# Patient Record
Sex: Male | Born: 1965 | Race: White | Hispanic: No | Marital: Married | Smoking: Former smoker
Health system: Southern US, Community
[De-identification: ages and names within clinical notes are randomized; demographics above are authoritative.]

## PROBLEM LIST (undated history)

## (undated) DIAGNOSIS — F988 Other specified behavioral and emotional disorders with onset usually occurring in childhood and adolescence: Secondary | ICD-10-CM

## (undated) DIAGNOSIS — I10 Essential (primary) hypertension: Secondary | ICD-10-CM

## (undated) DIAGNOSIS — M51369 Other intervertebral disc degeneration, lumbar region without mention of lumbar back pain or lower extremity pain: Secondary | ICD-10-CM

## (undated) DIAGNOSIS — Z201 Contact with and (suspected) exposure to tuberculosis: Secondary | ICD-10-CM

## (undated) DIAGNOSIS — M5136 Other intervertebral disc degeneration, lumbar region: Secondary | ICD-10-CM

## (undated) HISTORY — DX: Other intervertebral disc degeneration, lumbar region without mention of lumbar back pain or lower extremity pain: M51.369

## (undated) HISTORY — DX: Other intervertebral disc degeneration, lumbar region: M51.36

## (undated) HISTORY — DX: Essential (primary) hypertension: I10

## (undated) HISTORY — PX: BACK SURGERY: SHX140

## (undated) HISTORY — PX: TRIGGER FINGER RELEASE: SHX641

## (undated) HISTORY — PX: SHOULDER SURGERY: SHX246

---

## 2007-01-13 ENCOUNTER — Encounter: Admission: RE | Admit: 2007-01-13 | Discharge: 2007-01-13 | Payer: Self-pay | Admitting: Occupational Medicine

## 2007-06-24 ENCOUNTER — Ambulatory Visit (HOSPITAL_COMMUNITY): Admission: RE | Admit: 2007-06-24 | Discharge: 2007-06-25 | Payer: Self-pay | Admitting: Neurological Surgery

## 2010-06-13 NOTE — Op Note (Signed)
NAMESANTIAGO, STENZEL NO.:  [id number]   MEDICAL RECORD NO.:  [id number]          PATIENT TYPE:  OIB   LOCATION:  3526                         FACILITY:  [facility]   PHYSICIAN:  Stefani Dama, M.D.  DATE OF BIRTH:  [birth year]   DATE OF PROCEDURE:  06/24/2007  DATE OF DISCHARGE:                               OPERATIVE REPORT   PREOPERATIVE DIAGNOSIS:  Herniated nucleus pulposus, L4-L5 with  bilateral lower lumbar radiculopathy, pars defects at L5.   POSTOPERATIVE DIAGNOSIS:  Herniated nucleus pulposus, L4-L5 with  bilateral lower lumbar radiculopathy, pars defects at L5.   PROCEDURES:  Bilateral microdiskectomy at L4-L5 with operating  microscope, microdissection technique.   SURGEON:  Stefani Dama, MD   FIRST ASSISTANT:  Hilda Lias, MD   ANESTHESIA:  General endotracheal.   INDICATIONS:  Alan Odonnell is a 45 year old individual who some 5 or 6  years ago had surgery performed at the L5-S1 level.  He was told that he  had effusion at the time.  He had done well from point of view on his  back until the more recent past.  He notes that he has always had some  low-grade chronic back pain, but not very severe and now he developed  bilateral lower extremity pain that has become unrelenting over the past  5-6 weeks' time.  He notes that he can be comfortable only in the flexed  forward or slightly seated position and standing tends to aggravate the  pain substantially.  He has been advised regarding surgical  decompression of a large centrally herniated disk at L4-L5 with severe  central canal stenosis.   PROCEDURE:  The patient was brought to the operating room supine on the  stretcher after smooth induction of general endotracheal anesthesia.  He  was turned prone.  Back was prepped with alcohol and DuraPrep and draped  in sterile fashion.  A midline incision was created just above the  incision and then the incision was cut elliptically around his old  scar  to excise it.  The dissection was carried down through the subcutaneous  tissues and the lumbodorsal fascia was opened on either side of midline.  Two localizing radiographs identified the L4-L5 space positively.  Then  by dissecting away significant hypertrophied tissue in the region of the  facet in this area, laminotomy was created using a high-speed bur and a  4-mm bit remove portions of bone inferior margin of facets.  Dissection  was carried out to the medial aspect of the facet joints and a partial  facetectomy was completed in this region.  Common dural tube was  identified.  Yellow ligament was thickened and redundant and taken up  and allowed for good decompression of the canal centrally.  The  dissection was then carried down to the path of the L5 nerve root and  there was noted be a significant mass just underneath this lateral  margin of the nerve indenting the nerve and pushing it medially.  Mass  was found to be consistent with a free fragment of disk and this was  carefully dissected and teased away and then removed with a singular  large fragment being removed and then two other smaller fragments also  being found.  This was done from the right side.  This was done with the  use of the operating microscope and microdissection technique and then  the laminotomy on the opposite side was completed and we did a similar  decompression here, no fragments were found; however, the disk space was  entered and it was noted be significantly degenerated with central  bulging of the disk underneath the ligament and subligamentous  herniation of the disk in this area.  Ligament was opened and the disk  was taken out and the common dural tube and lateral recesses were  decompressed well.  Hemostasis in this area was achieved with bipolar  cautery and some small pledgets of Gelfoam.  The disk space was  evacuated of a substantial quantity of significantly degenerated disk   material.  This was done carefully and fully.  Once this was completed,  the hemostasis in the soft tissues was obtained.  Area was irrigated in  the disk space around the nerve roots, around the common dural tube, and  wound was noted that a good decompression was obtained.  Ultimately, the  lumbodorsal fascia was closed with #1 Vicryl in interrupted fashion.  A  20 mL of 0.50% Marcaine was injected into the paraspinous musculature.  A 2-0 Vicryl was used in the subcutaneous tissues, 3-0 Vicryl used  subcuticularly, and a dry sterile dressing was placed on the skin.  The  patient tolerated procedure well and was returned to recovery room in  stable condition.      Stefani Dama, M.D.  Electronically Signed     HJE/MEDQ  D:  06/24/2007  T:  06/25/2007  Job:  [id number]

## 2010-10-10 ENCOUNTER — Inpatient Hospital Stay (INDEPENDENT_AMBULATORY_CARE_PROVIDER_SITE_OTHER)
Admission: RE | Admit: 2010-10-10 | Discharge: 2010-10-10 | Disposition: A | Discharge status: Home / Self Care | Payer: Self-pay | Source: Ambulatory Visit | Attending: Family Medicine | Admitting: Family Medicine

## 2010-10-10 ENCOUNTER — Encounter: Payer: Self-pay | Admitting: Family Medicine

## 2010-10-10 DIAGNOSIS — Z0289 Encounter for other administrative examinations: Secondary | ICD-10-CM

## 2010-10-25 LAB — CBC
Platelets: 231
RDW: 12.7
WBC: 7.1

## 2011-01-01 NOTE — Progress Notes (Signed)
Summary: PHYSICAL rm 5   Vital Signs:  Patient Profile:   45 Years Old Male CC:      [employer] PE Height:     68 inches Weight:      185.75 pounds O2 Sat:      100 % O2 treatment:    Room Air Temp:     98.7 degrees F oral Pulse rate:   94 / minute Resp:     16 per minute BP sitting:   130 / 81  (left arm) Cuff size:   regular  Vitals Entered By: Clemens Catholic LPN (October 10, 2010 11:18 AM)              Vision Screening: Left eye w/o correction: 20 / 40 Right Eye w/o correction: 20 / 25 Both eyes w/o correction:  20/ 25        Vision Entered By: Clemens Catholic LPN (October 10, 2010 11:18 AM)    Updated Prior Medication List: ADDERALL 20 MG TABS (AMPHETAMINE-DEXTROAMPHETAMINE)  ADDERALL XR 20 MG XR24H-CAP (AMPHETAMINE-DEXTROAMPHETAMINE)   Current Allergies: ! VIOXXHistory of Present Illness Chief Complaint: [employer] PE History of Present Illness:  Subjective:  Patient presents for camp physical exam for [employer].  No complaints. Denies chest pain with activity.  No history of loss of consciousness during exercise.  No history of prolonged shortness of breath during exercise No family history of sudden death.  He has a history of L4-L5 back surgery; no recurrent back pain or radicular symptoms.  He has a history of ADD controlled with medication  See physical exam form this date for complete review.   REVIEW OF SYSTEMS Constitutional Symptoms      Denies fever, chills, night sweats, weight loss, weight gain, and fatigue.  Eyes       Denies change in vision, eye pain, eye discharge, glasses, contact lenses, and eye surgery. Ear/Nose/Throat/Mouth       Denies hearing loss/aids, change in hearing, ear pain, ear discharge, dizziness, frequent runny nose, frequent nose bleeds, sinus problems, sore throat, hoarseness, and tooth pain or bleeding.  Respiratory       Denies dry cough, productive cough, wheezing, shortness of breath, asthma, bronchitis, and  emphysema/COPD.  Cardiovascular       Denies murmurs, chest pain, and tires easily with exhertion.    Gastrointestinal       Denies stomach pain, nausea/vomiting, diarrhea, constipation, blood in bowel movements, and indigestion. Genitourniary       Denies painful urination, kidney stones, and loss of urinary control. Neurological       Denies paralysis, seizures, and fainting/blackouts. Musculoskeletal       Denies muscle pain, joint pain, joint stiffness, decreased range of motion, redness, swelling, muscle weakness, and gout.  Skin       Denies bruising, unusual mles/lumps or sores, and hair/skin or nail changes.  Psych       Denies mood changes, temper/anger issues, anxiety/stress, speech problems, depression, and sleep problems. Other Comments: The pt is here today for a [employer] PE.   Past History:  Past Medical History: ADD  Past Surgical History: 2 surgeries on L4-L5 (last surgery 2009) bone graft to LT foot reattachment of LT 4 th finger RT thumb graft  Family History: mom CVA, heart dz father- committed suicide age 58  Social History: Current Smoker less than 1/2 PPD Alcohol use-yes socially Drug use-no Smoking Status:  current Drug Use:  no   Objective:  Normal exam. See physical exam  form this date for exam.  Assessment New Problems: OTH GENERAL MEDICAL EXAMINATION ADMIN PURPOSES (ICD-V70.3)  NO CONTRINDICATIONS TO CAMP/SPORTS PARTICIPATION   Plan New Orders: No Charge Patient Arrived (NCPA0) [NCPA0] Planning Comments:   Form completed   The patient and/or caregiver has been counseled thoroughly with regard to medications prescribed including dosage, schedule, interactions, rationale for use, and possible side effects and they verbalize understanding.  Diagnoses and expected course of recovery discussed and will return if not improved as expected or if the condition worsens. Patient and/or caregiver verbalized understanding.   Orders Added: 1)   No Charge Patient Arrived (NCPA0) [NCPA0]

## 2011-01-03 ENCOUNTER — Emergency Department
Admission: EM | Admit: 2011-01-03 | Discharge: 2011-01-03 | Disposition: A | Discharge status: Home / Self Care | Payer: 59 | Source: Home / Self Care | Attending: Emergency Medicine | Admitting: Emergency Medicine

## 2011-01-03 ENCOUNTER — Encounter: Payer: Self-pay | Admitting: Emergency Medicine

## 2011-01-03 DIAGNOSIS — J069 Acute upper respiratory infection, unspecified: Secondary | ICD-10-CM

## 2011-01-03 DIAGNOSIS — R05 Cough: Secondary | ICD-10-CM

## 2011-01-03 HISTORY — DX: Other specified behavioral and emotional disorders with onset usually occurring in childhood and adolescence: F98.8

## 2011-01-03 MED ORDER — HYDROCODONE-HOMATROPINE 5-1.5 MG/5ML PO SYRP: 5.0000 mL | ORAL_SOLUTION | Freq: Four times a day (QID) | ORAL | Status: AC | PRN

## 2011-01-03 NOTE — ED Provider Notes (Signed)
History     CSN: [id number] Arrival date & time: 01/03/2011 10:30 AM   First MD Initiated Contact with Patient 01/03/11 1037      Chief Complaint  Patient presents with  . URI    (Consider location/radiation/quality/duration/timing/severity/associated sxs/prior treatment) HPI Alan Odonnell is a 45 y.o. male who complains of onset of cold symptoms for 3 days.  He had the flu shot about 2 weeks ago. Multiple people at his work have similar symptoms. No sore throat + cough No pleuritic pain ? wheezing + nasal congestion +post-nasal drainage No sinus pain/pressure No chest congestion No itchy/red eyes + earache No hemoptysis No SOB + chills/sweats + fever No nausea No vomiting No abdominal pain No diarrhea No skin rashes + fatigue + myalgias + headache    Past Medical History  Diagnosis Date  . ADD (attention deficit disorder)     No past surgical history on file.  No family history on file.  History  Substance Use Topics  . Smoking status: Not on file  . Smokeless tobacco: Not on file  . Alcohol Use:       Review of Systems  Allergies  Rofecoxib  Home Medications   Current Outpatient Rx  Name Route Sig Dispense Refill  . AMPHETAMINE-DEXTROAMPHET ER 20 MG PO CP24 Oral Take 20 mg by mouth every morning.        BP 123/83  Pulse 99  Temp(Src) 98.9 F (37.2 C) (Oral)  Resp 16  Ht 5\' 10"  (1.778 m)  Wt 188 lb (85.276 kg)  BMI 26.98 kg/m2  SpO2 99%  Physical Exam  Nursing note and vitals reviewed. Constitutional: He is oriented to person, place, and time. He appears well-developed and well-nourished.  HENT:  Head: Normocephalic and atraumatic.  Right Ear: Tympanic membrane, external ear and ear canal normal.  Left Ear: Tympanic membrane, external ear and ear canal normal.  Nose: Mucosal edema and rhinorrhea present.  Mouth/Throat: Posterior oropharyngeal erythema present. No oropharyngeal exudate or posterior oropharyngeal edema.  Neck: Neck  supple.  Cardiovascular: Regular rhythm and normal heart sounds.   Pulmonary/Chest: Effort normal and breath sounds normal. No respiratory distress.  Neurological: He is alert and oriented to person, place, and time.  Skin: Skin is warm and dry.  Psychiatric: He has a normal mood and affect. His speech is normal.    ED Course  Procedures (including critical care time)  Labs Reviewed - No data to display No results found.   No diagnosis found.    MDM  1) rapid flu test is positive for type A. No antibiotics given today. Since it has been already on multiple days, I also did not give him Tamiflu. I did however give him a prescription for cough medicine to use at night  And a note for work. 2)  Use nasal saline solution (over the counter) at least 3 times a day. 3)  Use over the counter decongestants like Zyrtec-D every 12 hours as needed to help with congestion.  If you have hypertension, do not take medicines with sudafed.  4)  Can take tylenol every 6 hours or motrin every 8 hours for pain or fever. 5)  Follow up with your primary doctor if no improvement in 5-7 days, sooner if increasing pain, fever, or new symptoms.     Lily Kocher, MD 01/03/11 1056

## 2011-01-03 NOTE — ED Notes (Signed)
Sinus drainage, cough, headache, body aches, chills x 3days

## 2011-09-06 ENCOUNTER — Encounter: Payer: Self-pay | Admitting: Family Medicine

## 2011-10-08 ENCOUNTER — Encounter: Payer: Self-pay | Admitting: *Deleted

## 2011-10-08 DIAGNOSIS — M5136 Other intervertebral disc degeneration, lumbar region: Secondary | ICD-10-CM | POA: Insufficient documentation

## 2011-10-08 DIAGNOSIS — M51369 Other intervertebral disc degeneration, lumbar region without mention of lumbar back pain or lower extremity pain: Secondary | ICD-10-CM | POA: Insufficient documentation

## 2014-03-15 ENCOUNTER — Observation Stay (HOSPITAL_COMMUNITY)
Admission: EM | Admit: 2014-03-15 | Discharge: 2014-03-18 | Disposition: A | Discharge status: Home / Self Care | Payer: Managed Care, Other (non HMO) | Attending: Internal Medicine | Admitting: Internal Medicine

## 2014-03-15 ENCOUNTER — Emergency Department (HOSPITAL_COMMUNITY): Payer: Managed Care, Other (non HMO)

## 2014-03-15 ENCOUNTER — Encounter (HOSPITAL_COMMUNITY): Payer: Self-pay | Admitting: Family Medicine

## 2014-03-15 DIAGNOSIS — R072 Precordial pain: Secondary | ICD-10-CM

## 2014-03-15 DIAGNOSIS — I2510 Atherosclerotic heart disease of native coronary artery without angina pectoris: Secondary | ICD-10-CM | POA: Diagnosis not present

## 2014-03-15 DIAGNOSIS — R079 Chest pain, unspecified: Secondary | ICD-10-CM | POA: Diagnosis present

## 2014-03-15 DIAGNOSIS — M5136 Other intervertebral disc degeneration, lumbar region: Secondary | ICD-10-CM | POA: Insufficient documentation

## 2014-03-15 DIAGNOSIS — Z720 Tobacco use: Secondary | ICD-10-CM | POA: Diagnosis present

## 2014-03-15 DIAGNOSIS — F988 Other specified behavioral and emotional disorders with onset usually occurring in childhood and adolescence: Secondary | ICD-10-CM | POA: Diagnosis not present

## 2014-03-15 DIAGNOSIS — F17210 Nicotine dependence, cigarettes, uncomplicated: Secondary | ICD-10-CM | POA: Insufficient documentation

## 2014-03-15 DIAGNOSIS — R0602 Shortness of breath: Secondary | ICD-10-CM | POA: Diagnosis present

## 2014-03-15 DIAGNOSIS — J40 Bronchitis, not specified as acute or chronic: Secondary | ICD-10-CM

## 2014-03-15 DIAGNOSIS — Z8249 Family history of ischemic heart disease and other diseases of the circulatory system: Secondary | ICD-10-CM

## 2014-03-15 DIAGNOSIS — I2584 Coronary atherosclerosis due to calcified coronary lesion: Secondary | ICD-10-CM | POA: Diagnosis not present

## 2014-03-15 DIAGNOSIS — R05 Cough: Secondary | ICD-10-CM

## 2014-03-15 HISTORY — DX: Contact with and (suspected) exposure to tuberculosis: Z20.1

## 2014-03-15 LAB — CBC
HCT: 43.2 % (ref 39.0–52.0)
HCT: 40 % (ref 39.0–52.0)
Hemoglobin: 14.6 g/dL (ref 13.0–17.0)
Hemoglobin: 13.2 g/dL (ref 13.0–17.0)
MCH: 30 pg (ref 26.0–34.0)
MCH: 30.1 pg (ref 26.0–34.0)
MCHC: 33.8 g/dL (ref 30.0–36.0)
MCHC: 33 g/dL (ref 30.0–36.0)
MCV: 88.9 fL (ref 78.0–100.0)
MCV: 91.1 fL (ref 78.0–100.0)
PLATELETS: 171 10*3/uL (ref 150–400)
Platelets: 177 10*3/uL (ref 150–400)
RBC: 4.86 MIL/uL (ref 4.22–5.81)
RBC: 4.39 MIL/uL (ref 4.22–5.81)
RDW: 13 % (ref 11.5–15.5)
RDW: 13.1 % (ref 11.5–15.5)
WBC: 5.5 10*3/uL (ref 4.0–10.5)
WBC: 3.9 10*3/uL — AB (ref 4.0–10.5)

## 2014-03-15 LAB — LIPID PANEL
Cholesterol: 175 mg/dL (ref 0–200)
HDL: 40 mg/dL (ref >39)
LDL CALC: 113 mg/dL — AB (ref 0–99)
TRIGLYCERIDES: 109 mg/dL (ref <150)
Total CHOL/HDL Ratio: 4.4 RATIO
VLDL: 22 mg/dL (ref 0–40)

## 2014-03-15 LAB — BASIC METABOLIC PANEL
Anion gap: 7 (ref 5–15)
BUN: 13 mg/dL (ref 6–23)
CALCIUM: 9 mg/dL (ref 8.4–10.5)
CO2: 26 mmol/L (ref 19–32)
Chloride: 103 mmol/L (ref 96–112)
Creatinine, Ser: 0.95 mg/dL (ref 0.50–1.35)
Glucose, Bld: 97 mg/dL (ref 70–99)
Potassium: 4 mmol/L (ref 3.5–5.1)
SODIUM: 136 mmol/L (ref 135–145)

## 2014-03-15 LAB — D-DIMER, QUANTITATIVE: D-Dimer, Quant: <0.27 ug/mL-FEU (ref 0.00–0.48)

## 2014-03-15 LAB — CREATININE, SERUM
CREATININE: 0.97 mg/dL (ref 0.50–1.35)
GFR calc Af Amer: >90 mL/min (ref >90)

## 2014-03-15 LAB — TROPONIN I
Troponin I: <0.03 ng/mL (ref <0.031)
Troponin I: <0.03 ng/mL (ref <0.031)

## 2014-03-15 LAB — I-STAT TROPONIN, ED: Troponin i, poc: 0 ng/mL (ref 0.00–0.08)

## 2014-03-15 LAB — BRAIN NATRIURETIC PEPTIDE: B Natriuretic Peptide: 7.4 pg/mL (ref 0.0–100.0)

## 2014-03-15 MED ORDER — AMPHETAMINE-DEXTROAMPHETAMINE 10 MG PO TABS
20.0000 mg | ORAL_TABLET | Freq: Two times a day (BID) | ORAL | Status: DC
  Administered 2014-03-16 – 2014-03-18 (×5): 20 mg via ORAL
  Filled 2014-03-15 (×5): qty 2

## 2014-03-15 MED ORDER — NICOTINE 21 MG/24HR TD PT24
21.0000 mg | MEDICATED_PATCH | Freq: Every day | TRANSDERMAL | Status: DC
  Administered 2014-03-15 – 2014-03-18 (×4): 21 mg via TRANSDERMAL
  Filled 2014-03-15 (×4): qty 1

## 2014-03-15 MED ORDER — MORPHINE SULFATE 4 MG/ML IJ SOLN
4.0000 mg | Freq: Once | INTRAMUSCULAR | Status: AC
  Administered 2014-03-15: 4 mg via INTRAVENOUS
  Filled 2014-03-15: qty 1

## 2014-03-15 MED ORDER — ASPIRIN EC 325 MG PO TBEC
325.0000 mg | DELAYED_RELEASE_TABLET | Freq: Every day | ORAL | Status: DC
  Administered 2014-03-16 – 2014-03-18 (×3): 325 mg via ORAL
  Filled 2014-03-15 (×4): qty 1

## 2014-03-15 MED ORDER — GI COCKTAIL ~~LOC~~: 30.0000 mL | Freq: Four times a day (QID) | ORAL | Status: DC | PRN

## 2014-03-15 MED ORDER — ALPRAZOLAM 0.25 MG PO TABS: 0.2500 mg | ORAL_TABLET | Freq: Two times a day (BID) | ORAL | Status: DC | PRN

## 2014-03-15 MED ORDER — ENOXAPARIN SODIUM 40 MG/0.4ML ~~LOC~~ SOLN
40.0000 mg | SUBCUTANEOUS | Status: DC
  Administered 2014-03-15 – 2014-03-16 (×2): 40 mg via SUBCUTANEOUS
  Filled 2014-03-15 (×2): qty 0.4

## 2014-03-15 MED ORDER — AZITHROMYCIN 250 MG PO TABS
250.0000 mg | ORAL_TABLET | Freq: Every day | ORAL | Status: DC
  Administered 2014-03-16 – 2014-03-18 (×3): 250 mg via ORAL
  Filled 2014-03-15 (×3): qty 1

## 2014-03-15 MED ORDER — METOPROLOL TARTRATE 1 MG/ML IV SOLN
5.0000 mg | INTRAVENOUS | Status: DC | PRN
  Administered 2014-03-16 (×2): 5 mg via INTRAVENOUS
  Filled 2014-03-15: qty 5

## 2014-03-15 MED ORDER — METOPROLOL TARTRATE 25 MG PO TABS
25.0000 mg | ORAL_TABLET | Freq: Once | ORAL | Status: AC
  Administered 2014-03-16: 25 mg via ORAL
  Filled 2014-03-15: qty 1

## 2014-03-15 MED ORDER — AZITHROMYCIN 250 MG PO TABS
500.0000 mg | ORAL_TABLET | Freq: Every day | ORAL | Status: AC
  Administered 2014-03-15: 500 mg via ORAL
  Filled 2014-03-15: qty 2

## 2014-03-15 MED ORDER — ACETAMINOPHEN 325 MG PO TABS
650.0000 mg | ORAL_TABLET | ORAL | Status: DC | PRN
  Administered 2014-03-15 (×2): 650 mg via ORAL
  Filled 2014-03-15 (×3): qty 2

## 2014-03-15 MED ORDER — NITROGLYCERIN 2 % TD OINT
1.0000 [in_us] | TOPICAL_OINTMENT | Freq: Once | TRANSDERMAL | Status: AC
  Administered 2014-03-15: 1 [in_us] via TOPICAL
  Filled 2014-03-15: qty 1

## 2014-03-15 MED ORDER — GI COCKTAIL ~~LOC~~
30.0000 mL | Freq: Once | ORAL | Status: AC
  Administered 2014-03-15: 30 mL via ORAL
  Filled 2014-03-15: qty 30

## 2014-03-15 MED ORDER — ONDANSETRON HCL 4 MG/2ML IJ SOLN: 4.0000 mg | Freq: Four times a day (QID) | INTRAMUSCULAR | Status: DC | PRN

## 2014-03-15 MED ORDER — KETOROLAC TROMETHAMINE 30 MG/ML IJ SOLN
30.0000 mg | Freq: Once | INTRAMUSCULAR | Status: AC
  Administered 2014-03-15: 30 mg via INTRAVENOUS
  Filled 2014-03-15: qty 1

## 2014-03-15 MED ORDER — MORPHINE SULFATE 2 MG/ML IJ SOLN
2.0000 mg | INTRAMUSCULAR | Status: DC | PRN
  Administered 2014-03-15 – 2014-03-16 (×4): 2 mg via INTRAVENOUS
  Filled 2014-03-15 (×4): qty 1

## 2014-03-15 MED ORDER — SODIUM CHLORIDE 0.9 % IV BOLUS (SEPSIS)
1000.0000 mL | Freq: Once | INTRAVENOUS | Status: AC
  Administered 2014-03-15: 1000 mL via INTRAVENOUS

## 2014-03-15 NOTE — ED Notes (Signed)
Patient transported to X-ray 

## 2014-03-15 NOTE — ED Provider Notes (Signed)
CSN: [id number]     Arrival date & time 03/15/14  1300 History   First MD Initiated Contact with Patient 03/15/14 1301     No chief complaint on file.    (Consider location/radiation/quality/duration/timing/severity/associated sxs/prior Treatment) HPI Comments: Alan Odonnell is a 49 y.o. male with a PMHx of ADD and lumbar DDD, who presents to the ED with complaints of sudden onset chest pain that began at 7 AM while patient was standing. He states that the pain is a central and left-sided pressure-like pain which is nonradiating and constant, improved with nitroglycerin 2 and aspirin given in route, and with no known aggravating factors. He states that at the worst it was 8/10, although now he states it's a 2/10. Denies any change with exertion, breathing, or movement. States that 2-3 days ago he developed left shoulder pain and shortness of breath with a cough and clear sputum production, but did not think this was concerning until he developed chest pain earlier today. He states that when he felt the chest pain started, he felt lightheaded and diaphoretic which alarmed him and caused him to call 911. Additionally endorses some sinus congestion and clear rhinorrhea with intermittent headaches which he states feels like sinus pressure in the front of his head. He denies any fevers, chills, jaw neck or back pain, leg swelling, recent travel or immobilization, recent surgeries or sick contacts, history of DVT or PE, abdominal pain, nausea, vomiting, diarrhea, constipation, heartburn, dysuria, hematuria, numbness, tingling, weakness, or IV drug use. Denies syncope or vision changes. States the HA is similar to prior headaches. States he's on his feet a lot, and denies claudication or PND. States he's never had cardiac issues before, but has a family hx of MI in both grandparents.   Patient is a 49 y.o. male presenting with chest pain. The history is provided by the patient. No language interpreter was used.    Chest Pain Pain location:  Substernal area and L chest Pain quality: pressure   Pain radiates to:  Does not radiate Pain radiates to the back: no   Pain severity:  Mild (8/10 at worst, 2/10 at present) Onset quality:  Sudden Duration:  6 hours Timing:  Constant Progression:  Waxing and waning Chronicity:  New Context: at rest   Relieved by:  Aspirin and nitroglycerin Worsened by:  Nothing tried Ineffective treatments:  None tried Associated symptoms: cough, diaphoresis, headache (now resolved) and shortness of breath   Associated symptoms: no abdominal pain, no altered mental status, no anxiety, no back pain, no claudication, no dizziness, no dysphagia, no fever, no heartburn, no lower extremity edema, no nausea, no numbness, no orthopnea, no palpitations, no PND, no syncope, not vomiting and no weakness   Risk factors: male sex and smoking   Risk factors: no coronary artery disease, no diabetes mellitus, no hypertension, no immobilization, no prior DVT/PE and no surgery     Past Medical History  Diagnosis Date  . ADD (attention deficit disorder)   . DDD (degenerative disc disease), lumbar    No past surgical history on file. No family history on file. History  Substance Use Topics  . Smoking status: Not on file  . Smokeless tobacco: Not on file  . Alcohol Use: Not on file    Review of Systems  Constitutional: Positive for diaphoresis. Negative for fever and chills.  HENT: Positive for congestion, rhinorrhea and sinus pressure. Negative for ear discharge, ear pain, sore throat and trouble swallowing.   Eyes: Negative for  visual disturbance.  Respiratory: Positive for cough and shortness of breath. Negative for wheezing.   Cardiovascular: Positive for chest pain. Negative for palpitations, orthopnea, claudication, leg swelling, syncope and PND.  Gastrointestinal: Negative for heartburn, nausea, vomiting, abdominal pain, diarrhea, constipation and blood in stool.   Genitourinary: Negative for dysuria, hematuria and flank pain.  Musculoskeletal: Negative for myalgias, back pain and arthralgias.  Skin: Negative for color change.  Allergic/Immunologic: Negative for immunocompromised state.  Neurological: Positive for light-headedness (now resolved) and headaches (now resolved). Negative for dizziness, syncope, weakness and numbness.  Psychiatric/Behavioral: Negative for confusion.   10 Systems reviewed and are negative for acute change except as noted in the HPI.    Allergies  Rofecoxib  Home Medications   Prior to Admission medications   Medication Sig Start Date End Date Taking? Authorizing Provider  amphetamine-dextroamphetamine (ADDERALL XR) 20 MG 24 hr capsule Take 20 mg by mouth every morning.      Historical Provider, MD   BP 132/77 mmHg  Pulse 108  Temp(Src) 99.3 F (37.4 C) (Oral)  Resp 18  SpO2 97% Physical Exam  Constitutional: He is oriented to person, place, and time. Vital signs are normal. He appears well-developed and well-nourished.  Non-toxic appearance. No distress.  Afebrile, nontoxic, NAD, slightly tachycardic with HR ranging from 98-104 during exam. Speaking in full sentences, no hypoxia  HENT:  Head: Normocephalic and atraumatic.  Nose: Mucosal edema and rhinorrhea present. Right sinus exhibits maxillary sinus tenderness. Left sinus exhibits maxillary sinus tenderness.  Mouth/Throat: Uvula is midline, oropharynx is clear and moist and mucous membranes are normal. No trismus in the jaw. No uvula swelling.  Ears clear bilaterally Nose with mild edema and erythema, slight rhinorrhea, with maxillary sinus TTP bilaterally Oropharynx clear  Eyes: Conjunctivae and EOM are normal. Pupils are equal, round, and reactive to light. Right eye exhibits no discharge. Left eye exhibits no discharge.  Neck: Normal range of motion. Neck supple. No JVD present.  FROM intact without spinous process or paraspinous muscle TTP, no bony  stepoffs or deformities, no muscle spasms. No rigidity or meningeal signs. No bruising or swelling. No JVD  Cardiovascular: Regular rhythm, normal heart sounds and intact distal pulses.  Tachycardia present.  Exam reveals no gallop and no friction rub.   No murmur heard. HR 98-104 during exam, reg rhythm, nl s1/s2, no m/r/g, distal pulses intact, no pedal edema  Pulmonary/Chest: Effort normal. No respiratory distress. He has decreased breath sounds. He has no wheezes. He has no rhonchi. He has no rales. He exhibits tenderness. He exhibits no crepitus, no deformity and no retraction.    CTAB in all lung fields, no w/r/r, although possibly slightly diminished breath sounds in bases, difficult to distinguish from poor inspiratory effort. No hypoxia or increased WOB, speaking in full sentences, SpO2 97% on RA  Mildly TTP over central and L lower rib cage, no crepitus or retraction  Abdominal: Soft. Normal appearance and bowel sounds are normal. He exhibits no distension. There is no tenderness. There is no rigidity, no rebound, no guarding, no CVA tenderness, no tenderness at McBurney's point and negative Murphy's sign.  Musculoskeletal: Normal range of motion.  MAE x4 Strength and sensation grossly intact Neg homan's bilaterally No pedal edema  Neurological: He is alert and oriented to person, place, and time. He has normal strength. No sensory deficit.  Skin: Skin is warm, dry and intact. No rash noted.  Psychiatric: He has a normal mood and affect.  Nursing note and  vitals reviewed.   ED Course  Procedures (including critical care time) Labs Review Labs Reviewed  CBC  BASIC METABOLIC PANEL  BRAIN NATRIURETIC PEPTIDE  D-DIMER, QUANTITATIVE  I-STAT TROPOININ, ED    Imaging Review Dg Chest 2 View  03/15/2014   CLINICAL DATA:  Left-sided chest pain starting today.  EXAM: CHEST  2 VIEW  COMPARISON:  None.  FINDINGS: There is mild interstitial coarsening from bronchial cuffing. There is no  edema, consolidation, effusion, or pneumothorax. Normal heart size and mediastinal contours. No osseous findings to explain chest pain.  IMPRESSION: 1. Mild bronchitic changes. 2. No explanation for left chest pain.   Electronically Signed   By: Marnee Spring M.D.   On: 03/15/2014 14:24     EKG Interpretation   Date/Time:  Monday March 15 2014 13:06:06 EST Ventricular Rate:  103 PR Interval:  132 QRS Duration: 101 QT Interval:  326 QTC Calculation: 427 R Axis:   33 Text Interpretation:  Sinus tachycardia Abnormal R-wave progression, early  transition Confirmed by ZAVITZ  MD, JOSHUA ([id number]) on 03/15/2014 3:13:57 PM      MDM   Final diagnoses:  Chest pain, unspecified chest pain type  SOB (shortness of breath)  Bronchitis    49 y.o. male here with CP x6hrs, SOB and shoulder pain x2-3 days, cough x2-3 days with white sputum production. Given ASA and NTG en route with relief of symptoms. Found to be in sinus tachycardia, HR goes from 98-104 during exam. EKG showing sinus tachy and R wave early transition. Will obtain CXR and labs. Chest pain is somewhat reproducible on exam therefore could be musculoskeletal, but given tachycardia will obtain dimer since pt is low risk. If dimer is positive, will need CTA. Symptoms unchanged with exertion, but given family hx of heart attacks, may need obs admission for ACS rule out since symptoms improved with NTG and ASA and story is somewhat convincing for cardiac. Will give morphine now since ASA and NTG given already with relief. Will give GI cocktail. Will reassess shortly.   2:45 PM Pt having return of CP, will give NTG paste. Trop neg, Dimer neg, CBC and BMP WNL. BNP WNL. CXR showing mild bronchitic changes which could be cause of cough, and could possibly explain some of his symptoms although given that history is somewhat concerning for cardiac etiology, and recurrence of symptoms while here, will like admit. HEART score is 4.   3:23 PM Pt  having worsening of HA since being given NTG paste. Will give toradol, and will admit for chest pain rule out. No longer tachycardic and chest pain somewhat improved.   3:43 PM Chales Abrahams York PA-C from [facility] returning page, will admit pt to tele obs for cardiac rule out. Please see her dictation for further documentation of care.   BP 127/73 mmHg  Pulse 86  Temp(Src) 99.3 F (37.4 C) (Oral)  Resp 15  SpO2 98%  Meds ordered this encounter  Medications  . morphine 4 MG/ML injection 4 mg    Sig:   . gi cocktail (Maalox,Lidocaine,Donnatal)    Sig:   . sodium chloride 0.9 % bolus 1,000 mL    Sig:   . amphetamine-dextroamphetamine (ADDERALL) 20 MG tablet    Sig: Take 20 mg by mouth 2 (two) times daily.  . nitroGLYCERIN (NITROGLYN) 2 % ointment 1 inch    Sig:   . ketorolac (TORADOL) 30 MG/ML injection 30 mg    Sig:      Alan Augusta Strupp Camprubi-Soms,  PA-C 03/15/14 1544  Enid Skeens, MD 03/15/14 1700
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
## 2014-03-15 NOTE — ED Notes (Signed)
Pt having chest pain that started today. sts also some mild SOB over the past few days. sts wasn't doing anything strenuous when the pain started. sts 2 nitro in route and helped with pain. sts highest pain 4/10. Denies nausea.

## 2014-03-15 NOTE — Consult Note (Signed)
Cardiology Consult Note  Patient ID: Kimberley Dastrup MRN: [id number], DOB/AGE: [birth year]   Admit date: 03/15/2014   Primary Physician: Pcp Not In System Primary Cardiologist: New  Pt. Profile:   49 year old Caucasian male with past medical history of ADD and lumbar disease present with CP at rest  Problem List  Past Medical History  Diagnosis Date  . ADD (attention deficit disorder)   . DDD (degenerative disc disease), lumbar     Past Surgical History  Procedure Laterality Date  . Back surgery       Allergies  Allergies  Allergen Reactions  . Rofecoxib     unknown    HPI  The patient is a 49 year old Caucasian male with past medical history of ADD and lumbar disease. He has no known past cardiac history and has never seen a cardiologist before. He denies any prior history of strokes lower extremity edema, orthopnea or paroxysmal nocturnal dyspnea. He works as a Journalist, newspaper, and his job involves heavy activities. He has not noticed any prior history of exertional chest pain or increasing shortness breath. 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 He did not go last month because his back pain. For the last 2 days, patient had a URI symptom involve productive cough and the left shoulder pain. This morning, while sitting in the front seat of a car doing diagnosis, he started experiencing left-sided substernal chest squeezing sensation. He also endorsed some diaphoresis and dizziness. The discomfort gradually intensified prompting the patient to seek medical attention at [facility].  On arrival his blood pressure was 132/77. Heart rate was elevated at 108. Temperature 99.3. CBC and BMP was WNL. BNP 7.4. Troponin negative. D-dimer negative. Chest x-ray was negative for acute process except mild bronchitic changes. Initial EKG showed sinus tachycardia without significant ST-T wave changes. Cardiology has been consulted for chest  pain.  Home Medications  Prior to Admission medications   Medication Sig Start Date End Date Taking? Authorizing Provider  amphetamine-dextroamphetamine (ADDERALL) 20 MG tablet Take 20 mg by mouth 2 (two) times daily.   Yes Historical Provider, MD    Family History  Family History  Problem Relation Age of Onset  . Stroke Mother   . Heart attack Mother     Social History  History   Social History  . Marital Status: Married    Spouse Name: N/A  . Number of Children: N/A  . Years of Education: N/A   Occupational History  . Not on file.   Social History Main Topics  . Smoking status: Current Every Day Smoker    Types: Cigarettes  . Smokeless tobacco: Not on file     Comment: per pt "light" smoker since age 60, did quit for 5 yrs in between, however has since picked up again  . Alcohol Use: 0.0 oz/week    0 Standard drinks or equivalent per week     Comment: 1-2 glass of wine per night  . Drug Use: No  . Sexual Activity: Not on file   Other Topics Concern  . Not on file   Social History Narrative     Review of Systems General:  No chills, fever, night sweats or weight changes.  Cardiovascular:  No dyspnea on exertion, edema, orthopnea, palpitations, paroxysmal nocturnal dyspnea. +chest pain Dermatological: No rash, lesions/masses Respiratory: No cough, dyspnea Urologic: No hematuria, dysuria Abdominal:   No nausea, vomiting, diarrhea, bright red blood per rectum, melena, or  hematemesis Neurologic:  No visual changes, wkns, changes in mental status. All other systems reviewed and are otherwise negative except as noted above.  Physical Exam  Blood pressure 111/75, pulse 82, temperature 99.3 F (37.4 C), temperature source Oral, resp. rate 19, SpO2 97 %.  General: Pleasant, NAD Psych: Normal affect. Neuro: Alert and oriented X 3. Moves all extremities spontaneously. HEENT: Normal  Neck: Supple without bruits or JVD. Lungs:  Resp regular and unlabored,  CTA. Heart: RRR no s3, s4, or murmurs. Abdomen: Soft, non-tender, non-distended, BS + x 4.  Extremities: No clubbing, cyanosis or edema. DP/PT/Radials 2+ and equal bilaterally.  Labs  Troponin [facility](Point of Care Test)  Recent Labs  03/15/14 1405  TROPIPOC 0.00   No results for input(s): CKTOTAL, CKMB, TROPONINI in the last 72 hours. Lab Results  Component Value Date   WBC 5.5 03/15/2014   HGB 14.6 03/15/2014   HCT 43.2 03/15/2014   MCV 88.9 03/15/2014   PLT 177 03/15/2014     Recent Labs Lab 03/15/14 1310  NA 136  K 4.0  CL 103  CO2 26  BUN 13  CREATININE 0.95  CALCIUM 9.0  GLUCOSE 97   No results found for: CHOL, HDL, LDLCALC, TRIG Lab Results  Component Value Date   DDIMER <0.27 03/15/2014     Radiology/Studies  Dg Chest 2 View  03/15/2014   CLINICAL DATA:  Left-sided chest pain starting today.  EXAM: CHEST  2 VIEW  COMPARISON:  None.  FINDINGS: There is mild interstitial coarsening from bronchial cuffing. There is no edema, consolidation, effusion, or pneumothorax. Normal heart size and mediastinal contours. No osseous findings to explain chest pain.  IMPRESSION: 1. Mild bronchitic changes. 2. No explanation for left chest pain.   Electronically Signed   By: Marnee SpringJonathon  Watts M.D.   On: 03/15/2014 14:24    ECG  Sinus tach with HR 100s   ASSESSMENT AND PLAN  1. Persistent chest pain  - atypical, cannot r/o PNA or bronchitis  - negative trop or EKG changes, improved with nitro  - cardiac risk factor: smoking, FHx  - trend trop overnight, plan to obtain coronary CT  2. Tobacco abuse 3. ADD   Signed, Azalee CourseMeng, Hao, PA-C 03/15/2014, 5:01 PM   The patient was seen, examined and discussed with Azalee CourseHao Meng, PA-C and I agree with the above.   A pleasant 49 year old male who presented with non-exertional sharp chest pain associated with SOB relieved by NTG. He is very active and denies any chest prior to this event.  The patient has been having cough for the last  two days and chills this morning. He had mildly increased temperature on arrival to the ED. His ECG is completely normal, the first troponin is negative. His risk factors include ongoing smoking and FH of premature CAD in his mother. Lipids were never checked. It appears that his pain is pleuritic sec to acute bronchitis/atypical pneumonia.   We will plan for a coronary CT. Crea is normal. Start lopressor 25 mg po x 1 for HR control.   Lars MassonELSON, Lugene Beougher H 03/15/2014

## 2014-03-16 ENCOUNTER — Encounter (HOSPITAL_COMMUNITY): Payer: Self-pay | Admitting: General Practice

## 2014-03-16 ENCOUNTER — Observation Stay (HOSPITAL_COMMUNITY): Payer: Managed Care, Other (non HMO)

## 2014-03-16 DIAGNOSIS — R0602 Shortness of breath: Secondary | ICD-10-CM | POA: Diagnosis present

## 2014-03-16 DIAGNOSIS — J40 Bronchitis, not specified as acute or chronic: Secondary | ICD-10-CM

## 2014-03-16 DIAGNOSIS — Z720 Tobacco use: Secondary | ICD-10-CM

## 2014-03-16 DIAGNOSIS — F909 Attention-deficit hyperactivity disorder, unspecified type: Secondary | ICD-10-CM

## 2014-03-16 DIAGNOSIS — R079 Chest pain, unspecified: Secondary | ICD-10-CM

## 2014-03-16 LAB — PROTIME-INR
INR: 1.02 (ref 0.00–1.49)
PROTHROMBIN TIME: 13.5 s (ref 11.6–15.2)

## 2014-03-16 LAB — TSH: TSH: 0.658 u[IU]/mL (ref 0.350–4.500)

## 2014-03-16 LAB — TROPONIN I: Troponin I: <0.03 ng/mL (ref <0.031)

## 2014-03-16 MED ORDER — SODIUM CHLORIDE 0.9 % IJ SOLN: 3.0000 mL | INTRAMUSCULAR | Status: DC | PRN

## 2014-03-16 MED ORDER — METOPROLOL TARTRATE 1 MG/ML IV SOLN
INTRAVENOUS | Status: AC
Start: 2014-03-16 — End: 2014-03-16
  Filled 2014-03-16: qty 5

## 2014-03-16 MED ORDER — SODIUM CHLORIDE 0.9 % IJ SOLN
3.0000 mL | Freq: Two times a day (BID) | INTRAMUSCULAR | Status: DC
Start: 2014-03-16 — End: 2014-03-17
  Administered 2014-03-17: 3 mL via INTRAVENOUS

## 2014-03-16 MED ORDER — SODIUM CHLORIDE 0.9 % IV SOLN
Freq: Once | INTRAVENOUS | Status: AC
  Administered 2014-03-17: 05:00:00 via INTRAVENOUS

## 2014-03-16 MED ORDER — ASPIRIN 81 MG PO CHEW
81.0000 mg | CHEWABLE_TABLET | ORAL | Status: AC
  Administered 2014-03-17: 81 mg via ORAL
  Filled 2014-03-16: qty 1

## 2014-03-16 MED ORDER — NITROGLYCERIN 0.4 MG SL SUBL
0.4000 mg | SUBLINGUAL_TABLET | Freq: Once | SUBLINGUAL | Status: AC
  Administered 2014-03-16: 0.8 mg via SUBLINGUAL

## 2014-03-16 MED ORDER — SODIUM CHLORIDE 0.9 % IV SOLN: 250.0000 mL | INTRAVENOUS | Status: DC | PRN

## 2014-03-16 MED ORDER — NITROGLYCERIN 0.4 MG SL SUBL
SUBLINGUAL_TABLET | SUBLINGUAL | Status: AC
  Filled 2014-03-16: qty 2

## 2014-03-16 MED ORDER — TRAMADOL HCL 50 MG PO TABS
50.0000 mg | ORAL_TABLET | Freq: Four times a day (QID) | ORAL | Status: DC | PRN
Start: 2014-03-16 — End: 2014-03-18
  Administered 2014-03-16 – 2014-03-17 (×4): 50 mg via ORAL
  Filled 2014-03-16 (×4): qty 1

## 2014-03-16 MED ORDER — IOHEXOL 350 MG/ML SOLN
80.0000 mL | Freq: Once | INTRAVENOUS | Status: AC | PRN
  Administered 2014-03-16: 80 mL via INTRAVENOUS

## 2014-03-16 NOTE — Progress Notes (Signed)
Echocardiogram 2D Echocardiogram has been performed.  Dorothey BasemanReel, Kallista Pae M 03/16/2014, 4:54 PM

## 2014-03-16 NOTE — Progress Notes (Signed)
UR completed 

## 2014-03-16 NOTE — Progress Notes (Signed)
PROGRESS NOTE  Alan Odonnell ZOX:[id number] DOB: Dec 09, 1965 DOA: 03/15/2014 PCP: Pcp Not In System  HPI/Recap of past 24 hours: Reported feeling better, wife at bedside  Assessment/Plan: Principal Problem:   Chest pain at rest Active Problems:   Tobacco abuse   ADD (attention deficit disorder)   Bronchitis   SOB (shortness of breath)  Chest pain with risk factor including smoking, family history. Cardiology consulted, coronary CTA ordered, mid lad lesion? Cardiac cath 2/17. On asa 325, lipid panel pending.  Wheezing: no wheezing this am. Early emphsematous changes on CTA   Smoker: smoking cessation euducation provided, nicotine patch  Psych: stable on adderall   Code Status: full  Family Communication: patient and wife  Disposition Plan: remain inpatient   Consultants:  cardiology  Procedures:  Cardiac cath 2/17  Antibiotics:  none   Objective: BP 126/71 mmHg  Pulse 89  Temp(Src) 99.8 F (37.7 C) (Oral)  Resp 20  Ht  (1.753 m)  Wt 83.235 kg (183 lb 8 oz)  BMI 27.09 kg/m2  SpO2 99% No intake or output data in the 24 hours ending 03/16/14 1920 Filed Weights   03/15/14 1724 03/16/14 0503  Weight: 83.054 kg (183 lb 1.6 oz) 83.235 kg (183 lb 8 oz)    Exam: General: well-developed, well-nourished male, lying in bed in NAD, smell cigarette smoke   Psych: Normal affect and insight, Not Suicidal or Homicidal, Awake Alert, Oriented X 3.  Neuro: No F.N deficits, ALL C.Nerves Intact, Strength 5/5 all 4 extremities, Sensation intact all 4 extremities.  ENT: Ears and Eyes appear Normal, Conjunctivae clear, PER. Moist oral mucosa without erythema or exudates.  Neck: Supple, No lymphadenopathy appreciated  Respiratory: Symmetrical chest wall movement, Good air movement bilaterally, CTAB.  Cardiac: RRR, No Murmurs, no LE edema noted, no JVD.   Abdomen: Positive bowel sounds, Soft, Non tender, Non distended, No masses appreciated  Skin:  No Cyanosis, Normal Skin Turgor, No Skin Rash or Bruise.  Extremities: Able to move all 4. 5/5 strength in each, no effusions.   Data Reviewed: Basic Metabolic Panel:  Recent Labs Lab 03/15/14 1310 03/15/14 1827  NA 136  --   K 4.0  --   CL 103  --   CO2 26  --   GLUCOSE 97  --   BUN 13  --   CREATININE 0.95 0.97  CALCIUM 9.0  --    Liver Function Tests: No results for input(s): AST, ALT, ALKPHOS, BILITOT, PROT, ALBUMIN in the last 168 hours. No results for input(s): LIPASE, AMYLASE in the last 168 hours. No results for input(s): AMMONIA in the last 168 hours. CBC:  Recent Labs Lab 03/15/14 1310 03/15/14 1827  WBC 5.5 3.9*  HGB 14.6 13.2  HCT 43.2 40.0  MCV 88.9 91.1  PLT 177 171   Cardiac Enzymes:    Recent Labs Lab 03/15/14 1827 03/15/14 2040 03/15/14 2327  TROPONINI <0.03 <0.03 <0.03   BNP (last 3 results)  Recent Labs  03/15/14 1310  BNP 7.4    ProBNP (last 3 results) No results for input(s): PROBNP in the last 8760 hours.  CBG: No results for input(s): GLUCAP in the last 168 hours.  No results found for this or any previous visit (from the past 240 hour(s)).   Studies: Dg Chest 2 View  03/15/2014   CLINICAL DATA:  Left-sided chest pain starting today.  EXAM: CHEST  2 VIEW  COMPARISON:  None.  FINDINGS: There is mild interstitial coarsening from bronchial  cuffing. There is no edema, consolidation, effusion, or pneumothorax. Normal heart size and mediastinal contours. No osseous findings to explain chest pain.  IMPRESSION: 1. Mild bronchitic changes. 2. No explanation for left chest pain.   Electronically Signed   By: Marnee SpringJonathon  Watts M.D.   On: 03/15/2014 14:24    Scheduled Meds: . amphetamine-dextroamphetamine  20 mg Oral BID  . aspirin EC  325 mg Oral Daily  . azithromycin  250 mg Oral Daily  . enoxaparin (LOVENOX) injection  40 mg Subcutaneous Q24H  . nicotine  21 mg Transdermal Daily     Continuous Infusions:   none     Martika Egler  [facility] Pager [phone]. If 7PM-7AM, please contact night-coverage at [email or web address], password [email or web address] 03/16/2014, 7:20 PM

## 2014-03-16 NOTE — Progress Notes (Signed)
    Subjective:  No chest pian like he had on admission ("like a ball in my chest")  Objective:  Vital Signs in the last 24 hours: Temp:  [98.4 F (36.9 C)-99.3 F (37.4 C)] 98.6 F (37 C) (02/16 0503) Pulse Rate:  [70-108] 97 (02/16 0503) Resp:  [13-20] 18 (02/16 0503) BP: (111-137)/(63-83) 114/83 mmHg (02/16 0503) SpO2:  [97 %-100 %] 100 % (02/16 0503) Weight:  [183 lb 1.6 oz (83.054 kg)-183 lb 8 oz (83.235 kg)] 183 lb 8 oz (83.235 kg) (02/16 0503)  Intake/Output from previous day: No intake or output data in the 24 hours ending 03/16/14 0934  Physical Exam: General appearance: alert, cooperative and no distress Lungs: clear to auscultation bilaterally Heart: regular rate and rhythm   Rate: 84  Rhythm: normal sinus rhythm  Lab Results:  Recent Labs  03/15/14 1310 03/15/14 1827  WBC 5.5 3.9*  HGB 14.6 13.2  PLT 177 171    Recent Labs  03/15/14 1310 03/15/14 1827  NA 136  --   K 4.0  --   CL 103  --   CO2 26  --   GLUCOSE 97  --   BUN 13  --   CREATININE 0.95 0.97    Recent Labs  03/15/14 2040 03/15/14 2327  TROPONINI <0.03 <0.03   No results for input(s): INR in the last 72 hours.  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 Troponin negative x 3, D-dimer WNL, CXR showed mild bronchitic changes.   Principal Problem:   Chest pain at rest Active Problems:   Tobacco abuse   ADD (attention deficit disorder)   PLAN: Cardiac CTA today.   Corine ShelterLuke Kilroy PA-C Beeper [phone] 03/16/2014, 9:34 AM   The patient was seen, examined and discussed with Corine ShelterLuke Kilroy, PA-C and I agree with the above.    A pleasant 49 year old male who presented with non-exertional sharp chest pain associated with SOB relieved by NTG. He is very active and denies any chest prior to this event. Troponin negative x 3, ECG  unchanged. No more chest pain overnight, just left sided discomfort. His cough has been worsening supporting dg of URI. He is awaiting coronary CT.   Lars MassonELSON, Everton Bertha H 03/16/2014

## 2014-03-16 NOTE — Progress Notes (Signed)
Noted to have a LAD lesion on coronary CT. Discussed risk and benefit with the patient and his wife, include bleeding, vascular/renal injury, arrhythmia, MI, stroke, loss of life or limb. They displayed clear understanding and agree to proceed. I have add him to tomorrow's cath schedule. Orders placed.  Ramond DialSigned, Ionia Schey PA Pager: [phone]

## 2014-03-16 NOTE — Care Management Note (Signed)
    Page 1 of 1   03/16/2014     11:41:16 AM CARE MANAGEMENT NOTE 03/16/2014  Patient:  Alan Odonnell,Alan Odonnell   Account Number:  [id number]  Date Initiated:  03/16/2014  Documentation initiated by:  Gae GallopOLE,ANGELA  Subjective/Objective Assessment:   PTA pt from home admitted with CP     Action/Plan:   Return to home. No needs identified by CM.   Anticipated DC Date:  03/17/2014   Anticipated DC Plan:  HOME/SELF CARE         Choice offered to / List presented to:             Status of service:  Completed, signed off Medicare Important Message given?  NO (If response is "NO", the following Medicare IM given date fields will be blank) Date Medicare IM given:   Medicare IM given by:   Date Additional Medicare IM given:   Additional Medicare IM given by:    Discharge Disposition:  HOME/SELF CARE  Per UR Regulation:  Reviewed for med. necessity/level of care/duration of stay  If discussed at Long Length of Stay Meetings, dates discussed:    Comments:  03/16/2014 @ 1115 Pt with no PCP, [facility]  info provided to pt

## 2014-03-17 ENCOUNTER — Encounter (HOSPITAL_COMMUNITY): Payer: Self-pay | Admitting: Cardiovascular Disease

## 2014-03-17 ENCOUNTER — Encounter (HOSPITAL_COMMUNITY)
Admission: EM | Disposition: A | Discharge status: Home / Self Care | Payer: Self-pay | Source: Home / Self Care | Attending: Emergency Medicine

## 2014-03-17 DIAGNOSIS — R079 Chest pain, unspecified: Secondary | ICD-10-CM

## 2014-03-17 DIAGNOSIS — I2510 Atherosclerotic heart disease of native coronary artery without angina pectoris: Secondary | ICD-10-CM | POA: Diagnosis present

## 2014-03-17 HISTORY — PX: LEFT HEART CATHETERIZATION WITH CORONARY ANGIOGRAM: SHX5451

## 2014-03-17 LAB — BASIC METABOLIC PANEL
ANION GAP: 3 — AB (ref 5–15)
BUN: 10 mg/dL (ref 6–23)
CHLORIDE: 108 mmol/L (ref 96–112)
CO2: 25 mmol/L (ref 19–32)
Calcium: 8.2 mg/dL — ABNORMAL LOW (ref 8.4–10.5)
Creatinine, Ser: 0.86 mg/dL (ref 0.50–1.35)
GFR calc Af Amer: >90 mL/min (ref >90)
GFR calc non Af Amer: >90 mL/min (ref >90)
Glucose, Bld: 103 mg/dL — ABNORMAL HIGH (ref 70–99)
Potassium: 4.2 mmol/L (ref 3.5–5.1)
Sodium: 136 mmol/L (ref 135–145)

## 2014-03-17 LAB — CK TOTAL AND CKMB (NOT AT ARMC)
CK TOTAL: 77 U/L (ref 7–232)
CK, MB: 1.3 ng/mL (ref 0.3–4.0)
RELATIVE INDEX: INVALID (ref 0.0–2.5)

## 2014-03-17 SURGERY — LEFT HEART CATHETERIZATION WITH CORONARY ANGIOGRAM
Anesthesia: LOCAL

## 2014-03-17 MED ORDER — HEPARIN SODIUM (PORCINE) 1000 UNIT/ML IJ SOLN
INTRAMUSCULAR | Status: AC
  Filled 2014-03-17: qty 1

## 2014-03-17 MED ORDER — ATORVASTATIN CALCIUM 40 MG PO TABS
40.0000 mg | ORAL_TABLET | Freq: Every day | ORAL | Status: DC
  Administered 2014-03-17: 40 mg via ORAL
  Filled 2014-03-17: qty 1

## 2014-03-17 MED ORDER — HEPARIN (PORCINE) IN NACL 2-0.9 UNIT/ML-% IJ SOLN
INTRAMUSCULAR | Status: AC
  Filled 2014-03-17: qty 1500

## 2014-03-17 MED ORDER — DOCUSATE SODIUM 100 MG PO CAPS
200.0000 mg | ORAL_CAPSULE | Freq: Two times a day (BID) | ORAL | Status: DC
  Administered 2014-03-17 – 2014-03-18 (×3): 200 mg via ORAL
  Filled 2014-03-17 (×3): qty 2

## 2014-03-17 MED ORDER — FENTANYL CITRATE 0.05 MG/ML IJ SOLN
INTRAMUSCULAR | Status: AC
  Filled 2014-03-17: qty 2

## 2014-03-17 MED ORDER — MIDAZOLAM HCL 2 MG/2ML IJ SOLN
INTRAMUSCULAR | Status: AC
  Filled 2014-03-17: qty 2

## 2014-03-17 MED ORDER — NITROGLYCERIN 1 MG/10 ML FOR IR/CATH LAB
INTRA_ARTERIAL | Status: AC
Start: 2014-03-17 — End: 2014-03-17
  Filled 2014-03-17: qty 10

## 2014-03-17 MED ORDER — CARVEDILOL 3.125 MG PO TABS
3.1250 mg | ORAL_TABLET | Freq: Two times a day (BID) | ORAL | Status: DC
Start: 2014-03-17 — End: 2014-03-18
  Administered 2014-03-17 – 2014-03-18 (×3): 3.125 mg via ORAL
  Filled 2014-03-17 (×3): qty 1

## 2014-03-17 MED ORDER — SODIUM CHLORIDE 0.9 % IV SOLN
INTRAVENOUS | Status: AC
  Administered 2014-03-17: 16:00:00 via INTRAVENOUS

## 2014-03-17 MED ORDER — LIDOCAINE HCL (PF) 1 % IJ SOLN
INTRAMUSCULAR | Status: AC
  Filled 2014-03-17: qty 30

## 2014-03-17 MED ORDER — VERAPAMIL HCL 2.5 MG/ML IV SOLN
INTRAVENOUS | Status: AC
  Filled 2014-03-17: qty 2

## 2014-03-17 NOTE — Progress Notes (Signed)
Subjective:  No chest pian like he had on admission ("like a ball in my chest")  . amphetamine-dextroamphetamine  20 mg Oral BID  . aspirin EC  325 mg Oral Daily  . azithromycin  250 mg Oral Daily  . docusate sodium  200 mg Oral BID  . enoxaparin (LOVENOX) injection  40 mg Subcutaneous Q24H  . nicotine  21 mg Transdermal Daily  . sodium chloride  3 mL Intravenous Q12H    Objective:  Vital Signs in the last 24 hours: Temp:  [97.8 F (36.6 C)-99.8 F (37.7 C)] 97.8 F (36.6 C) (02/17 0819) Pulse Rate:  [73-89] 74 (02/17 0819) Resp:  [18-20] 18 (02/17 0504) BP: (113-126)/(67-73) 113/71 mmHg (02/17 0819) SpO2:  [99 %-100 %] 100 % (02/17 0819) Weight:  [183 lb (83.008 kg)] 183 lb (83.008 kg) (02/17 0504)  Intake/Output from previous day:  Intake/Output Summary (Last 24 hours) at 03/17/14 0852 Last data filed at 03/17/14 0827  Gross per 24 hour  Intake    425 ml  Output    975 ml  Net   -550 ml    Physical Exam: General appearance: alert, cooperative and no distress Lungs: clear to auscultation bilaterally Heart: regular rate and rhythm   Rate: 84  Rhythm: normal sinus rhythm  Lab Results:  Recent Labs  03/15/14 1310 03/15/14 1827  WBC 5.5 3.9*  HGB 14.6 13.2  PLT 177 171    Recent Labs  03/15/14 1310 03/15/14 1827 03/17/14 0527  NA 136  --  136  K 4.0  --  4.2  CL 103  --  108  CO2 26  --  25  GLUCOSE 97  --  103*  BUN 13  --  10  CREATININE 0.95 0.97 0.86    Recent Labs  03/15/14 2040 03/15/14 2327  TROPONINI <0.03 <0.03    Recent Labs  03/16/14 1718  INR 1.02    Imaging: Imaging results have been reviewed  Cardiac Studies: Coronary CTA: 1. Coronary calcium score of 236. This was 95 percentile for age and sex matched control.  2. Normal coronary origin.  3. Right dominance.  4. 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 The  degree of stenosis might be overestimated by dense calcifications.  Cardiac cath is recommended.   Echo: 03/16/2013 - Left ventricle: The cavity size was normal. Wall thickness was normal. Systolic function was normal. The estimated ejection fraction was in the range of 60% to 65%. Wall motion was normal; there were no regional wall motion abnormalities. Left ventricular diastolic function parameters were normal. - Atrial septum: No defect or patent foramen ovale was identified.  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 Troponin negative x 3, D-dimer WNL, CXR showed mild bronchitic changes.   Principal Problem:   Chest pain at rest Active Problems:   Tobacco abuse   ADD (attention deficit disorder)   Bronchitis   SOB (shortness of breath)   A pleasant 49 year old male who presented with non-exertional sharp chest pain associated with SOB relieved by NTG and significant exertional fatigue x 3 months, ongoing smoker with abnormal coronary CTA with a long moderate calcified plaque in the proximal to mid LAD associated with 70 % stenosis in its distal portion. The degree of stenosis might be overestimated by dense calcifications.  Regardless of  cath results he will need intense medical therapy  - stop smoking - ASA indefinitely - start atorvastatin 40 mg po daily - start carvedilol 3.125 mg po BID   Lars MassonELSON, Alan Odonnell 03/17/2014

## 2014-03-17 NOTE — Progress Notes (Signed)
UR completed 

## 2014-03-17 NOTE — Interval H&P Note (Signed)
History and Physical Interval Note:  03/17/2014 3:03 PM  Alan Odonnell  has presented today for surgery, with the diagnosis of abnormal coronary CT, 70% proximal LAD lesion  The various methods of treatment have been discussed with the patient and family. After consideration of risks, benefits and other options for treatment, the patient has consented to  Procedure(s): LEFT HEART CATHETERIZATION WITH CORONARY ANGIOGRAM (N/A) as a surgical intervention .  The patient's history has been reviewed, patient examined, no change in status, stable for surgery.  I have reviewed the patient's chart and labs.  Questions were answered to the patient's satisfaction.     Alan Odonnell

## 2014-03-17 NOTE — Progress Notes (Signed)
Spoke with Dr. Adolm JosephWhitlock on call for cardiology.  Per Dr. Adolm JosephWhitlock, ok to give this evening's scheduled dose of Lovenox per order.  Clarification received on sodium chloride infusion 0.9%, maintenance fluids to start pre-cath.  Per Dr. Adolm JosephWhitlock, start fluids at 0400.

## 2014-03-17 NOTE — Progress Notes (Signed)
PROGRESS NOTE  Alan LymeDavid Odonnell ZOX:[id number] DOB: [birth year] DOA: 03/15/2014 PCP: Pcp Not In System  HPI/Recap of past 24 hours: Denies chest pain, no wheezing, does c/o nasal congestion  Assessment/Plan: Principal Problem:   Chest pain at rest Active Problems:   Tobacco abuse   ADD (attention deficit disorder)   SOB (shortness of breath)   CAD- LAD disease by CT  Chest pain with risk factor including smoking, family history. Cardiology consulted, coronary CTA ordered, mid lad lesion? Cardiac cath 2/17. On asa 325, lipid panel ldl 113, start statin.  Wheezing: no wheezing this am. Early emphsematous changes on CTA   Smoker: smoking cessation euducation provided, nicotine patch  Psych: stable on adderall   Code Status: full  Family Communication: patient and wife  Disposition Plan: remain inpatient   Consultants:  cardiology  Procedures:  Cardiac cath 2/17  Antibiotics:  none   Objective: BP 130/76 mmHg  Pulse 87  Temp(Src) 98.6 F (37 C) (Oral)  Resp 18  Ht 5\' 9"  (1.753 m)  Wt 83.008 kg (183 lb)  BMI 27.01 kg/m2  SpO2 100%  Intake/Output Summary (Last 24 hours) at 03/17/14 1800 Last data filed at 03/17/14 0827  Gross per 24 hour  Intake    425 ml  Output    975 ml  Net   -550 ml   Filed Weights   03/15/14 1724 03/16/14 0503 03/17/14 0504  Weight: 83.054 kg (183 lb 1.6 oz) 83.235 kg (183 lb 8 oz) 83.008 kg (183 lb)    Exam: General: well-developed, well-nourished male, lying in bed in NAD, smell cigarette smoke   Psych: Normal affect and insight, Not Suicidal or Homicidal, Awake Alert, Oriented X 3.  Neuro: No F.N deficits, ALL C.Nerves Intact, Strength 5/5 all 4 extremities, Sensation intact all 4 extremities.  ENT: Ears and Eyes appear Normal, Conjunctivae clear, PER. Moist oral mucosa without erythema or exudates.  Neck: Supple, No lymphadenopathy appreciated  Respiratory: Symmetrical chest wall movement, Good air movement  bilaterally, CTAB.  Cardiac: RRR, No Murmurs, no LE edema noted, no JVD.   Abdomen: Positive bowel sounds, Soft, Non tender, Non distended, No masses appreciated  Skin: No Cyanosis, Normal Skin Turgor, No Skin Rash or Bruise.  Extremities: Able to move all 4. 5/5 strength in each, no effusions.   Data Reviewed: Basic Metabolic Panel:  Recent Labs Lab 03/15/14 1310 03/15/14 1827 03/17/14 0527  NA 136  --  136  K 4.0  --  4.2  CL 103  --  108  CO2 26  --  25  GLUCOSE 97  --  103*  BUN 13  --  10  CREATININE 0.95 0.97 0.86  CALCIUM 9.0  --  8.2*   Liver Function Tests: No results for input(s): AST, ALT, ALKPHOS, BILITOT, PROT, ALBUMIN in the last 168 hours. No results for input(s): LIPASE, AMYLASE in the last 168 hours. No results for input(s): AMMONIA in the last 168 hours. CBC:  Recent Labs Lab 03/15/14 1310 03/15/14 1827  WBC 5.5 3.9*  HGB 14.6 13.2  HCT 43.2 40.0  MCV 88.9 91.1  PLT 177 171   Cardiac Enzymes:    Recent Labs Lab 03/15/14 1827 03/15/14 2040 03/15/14 2327 03/17/14 1450  CKTOTAL  --   --   --  77  CKMB  --   --   --  1.3  TROPONINI <0.03 <0.03 <0.03  --    BNP (last 3 results)  Recent Labs  03/15/14 1310  BNP  7.4    ProBNP (last 3 results) No results for input(s): PROBNP in the last 8760 hours.  CBG: No results for input(s): GLUCAP in the last 168 hours.  No results found for this or any previous visit (from the past 240 hour(s)).   Studies: Ct Coronary Morp W/cta Cor W/score W/ca W/cm &/or Wo/cm  03/16/2014   ADDENDUM REPORT: 03/16/2014 15:06  CLINICAL DATA:  49 year old smoker with chest pain  EXAM: Cardiac/Coronary  CT  TECHNIQUE: The patient was scanned on a Philips 256 scanner.  FINDINGS: A 120 kV prospective scan was triggered in the descending thoracic aorta at 111 HU's. Axial non-contrast 3 mm slices were carried out through the heart. The data set was analyzed on a dedicated work station and scored using the  Agatson method. Gantry rotation speed was 270 msecs and collimation was .9 mm. 10 mg of iv Metoprolol and 0.8 mg of sl NTG was given. The 3D data set was reconstructed in 5% intervals of the 67-82 % of the R-R cycle. Diastolic phases were analyzed on a dedicated work station using MPR, MIP and VRT modes. The patient received 80 cc of contrast.  Aorta: Normal caliber, minimal calcification in the aortic arch, no dissection.  Aortic Valve:  Trileaflet, no calcifications.  Coronary Arteries:  Normal origin.  Right dominance.  RCA is a large caliber dominant vessel that gives rise to RV acute marginal branch and fairly large PDA and PLA arteries. There is minimal calcified plaque in the proximal RCA associated with 0-25% stenosis. The rest of RCA has no plaque.  Left main is free of plaque and gives rise to LAD, ramus intermedius and LCX arteries.  LAD is a large caliber, long vessel that wraps around the apex. It gives rise to one diagonal branch.  LAD has a long moderate calcified plaque starting in the proximal and extending to mid LAD associated with 50-69% stenosis, however at the distal portion seems to be severe with stenosis equal or more than 70%. The remaining LAD has only minimal noncalcified plaque.  Diagonal artery has mild noncalcified ostial plaque associated with 25-50% stenosis.  Ramus intermedius is medium caliber vessel that has no significant plaque.  LCX artery is a small caliber non-dominant vessel that gives rise to one obtuse marginal branch. There is no plaque.  Other findings:  Normal size of the pulmonary artery. Normal drainage of the pulmonary veins. Small left atrial appendage with no filling defect. No other anomalies were identified.  IMPRESSION: 1. Coronary calcium score of 236. This was 95 percentile for age and sex matched control.  2.  Normal coronary origin.  3.  Right dominance.  4. One vessel CAD with a long moderate calcified plaque in the proximal to mid LAD associated with 70 %  stenosis in its distal portion. The degree of stenosis might be overestimated by dense calcifications.  Cardiac cath is recommended.  Tobias Alexander   Electronically Signed   By: Tobias Alexander   On: 03/16/2014 15:06   03/16/2014   EXAM: OVER-READ INTERPRETATION  CT CHEST  The following report is an over-read performed by radiologist Dr. [facility] on 03/16/2014. This over-read does not include interpretation of cardiac or coronary anatomy or pathology. The coronary calcium score/coronary CTA interpretation by the cardiologist is attached.  COMPARISON:  None.  FINDINGS: Chest wall: The visualized chest wall is unremarkable. No soft tissue lesions or significant bony findings.  Mediastinum: Scattered small mediastinal and hilar lymph nodes but  no mass or overt adenopathy. The aorta is normal in caliber. The esophagus is grossly normal.  Lungs/pleura: Dependent bibasilar atelectasis is noted but no worrisome pulmonary nodules or mass lesions. Scattered calcified granulomas are noted. Mild/early emphysematous changes. No bronchiectasis or interstitial lung disease.  IMPRESSION: Early emphysematous changes.  Scattered calcified granulomas but no worrisome pulmonary lesions.  Electronically Signed: By: Rudie Meyer M.D. On: 03/16/2014 12:18    Scheduled Meds: . amphetamine-dextroamphetamine  20 mg Oral BID  . aspirin EC  325 mg Oral Daily  . atorvastatin  40 mg Oral q1800  . azithromycin  250 mg Oral Daily  . carvedilol  3.125 mg Oral BID WC  . docusate sodium  200 mg Oral BID  . nicotine  21 mg Transdermal Daily     Continuous Infusions: . sodium chloride 100 mL/hr at 03/17/14 1555   none     Alan Odonnell  [facility] Pager [phone]. If 7PM-7AM, please contact night-coverage at [email or web address], password [email or web address] 03/17/2014, 6:00 PM

## 2014-03-17 NOTE — CV Procedure (Signed)
   Cardiac Catheterization Procedure Note  Name: Alan Odonnell MRN: [id number] DOB: [birth year]  Procedure: Left Heart Cath, Selective Coronary Angiography, LV angiography  Indication:  Chest pain with abnormal coronary CTA which showed 70% calcified proximal LAD stenosis  Medications:  Sedation:  1 mg IV Versed, 100 mcg IV Fentanyl  Contrast:  75 ml Omnipaque   Procedural Details: The right wrist was prepped, draped, and anesthetized with 1% lidocaine. Using the modified Seldinger technique, a 5 French sheath was introduced into the right radial artery. 3 mg of verapamil was administered through the sheath, weight-based unfractionated heparin was administered intravenously. A Jackie catheter was used for selective coronary angiography. A pigtail catheter was used for left ventriculography. Catheter exchanges were performed over an exchange length guidewire. There were no immediate procedural complications. A TR band was used for radial hemostasis at the completion of the procedure.  The patient was transferred to the post catheterization recovery area for further monitoring.  Procedural Findings:  Hemodynamics: AO:  115/67   mmHg LV:  118/8    mmHg LVEDP: 14  mmHg  Coronary angiography: Coronary dominance: Right   Left Main:  Normal in size with no significant disease.  Left Anterior Descending (LAD):  Normal in size with moderate calcifications proximally causing 40% tubular stenosis. The rest of the vessel has minor irregularities.  1st diagonal (D1):  Large in size with no significant disease.  2nd diagonal (D2):  Small in size with no significant disease.  3rd diagonal (D3):  Small in size with no significant disease.  Circumflex (LCx):  Normal in size and nondominant. The vessel has minor irregularities.  1st obtuse marginal:  Large in size with no significant disease.  2nd obtuse marginal:  Normal in size with no significant disease.  3rd obtuse marginal:  Normal in  size with no significant disease.   Right Coronary Artery: Normal in size and dominant. There is 20% mid RCA stenosis. The rest of the vessel has no significant disease.  Posterior descending artery: Normal in size with no significant disease.  Posterior AV segment: Normal in size with no significant disease.  Posterolateral branchs:  3 posterobranches with no significant disease.  Left ventriculography: Left ventricular systolic function is normal , LVEF is estimated at 55-60 %, there is no significant mitral regurgitation   Final Conclusions:   1. Mild to moderate nonobstructive coronary artery disease with calcified 40% stenosis in the proximal LAD. 2. Normal LV systolic function.  Recommendations:  Recommend aggressive medical therapy including smoking cessation, treatment with a statin and low-dose aspirin.  [name] MD, [facility] 03/17/2014, 3:39 PM

## 2014-03-17 NOTE — Progress Notes (Signed)
    Subjective:  No chest pain overnight  Objective:  Vital Signs in the last 24 hours: Temp:  [97.8 F (36.6 C)-99.8 F (37.7 C)] 97.8 F (36.6 C) (02/17 0819) Pulse Rate:  [73-89] 74 (02/17 0819) Resp:  [18-20] 18 (02/17 0504) BP: (113-126)/(67-73) 113/71 mmHg (02/17 0819) SpO2:  [99 %-100 %] 100 % (02/17 0819) Weight:  [183 lb (83.008 kg)] 183 lb (83.008 kg) (02/17 0504)  Intake/Output from previous day:  Intake/Output Summary (Last 24 hours) at 03/17/14 0903 Last data filed at 03/17/14 0827  Gross per 24 hour  Intake    425 ml  Output    975 ml  Net   -550 ml    Physical Exam: General appearance: alert, cooperative and no distress Lungs: clear to auscultation bilaterally Heart: regular rate and rhythm   Rate: 74  Rhythm: normal sinus rhythm  Lab Results:  Recent Labs  03/15/14 1310 03/15/14 1827  WBC 5.5 3.9*  HGB 14.6 13.2  PLT 177 171    Recent Labs  03/15/14 1310 03/15/14 1827 03/17/14 0527  NA 136  --  136  K 4.0  --  4.2  CL 103  --  108  CO2 26  --  25  GLUCOSE 97  --  103*  BUN 13  --  10  CREATININE 0.95 0.97 0.86    Recent Labs  03/15/14 2040 03/15/14 2327  TROPONINI <0.03 <0.03    Recent Labs  03/16/14 1718  INR 1.02    Imaging: Imaging results have been reviewed  Cardiac Studies: CTA One vessel CAD with a long moderate calcified plaque in the proximal to mid LAD associated with 70 % stenosis in its distal portion. The degree of stenosis might be overestimated by dense calcifications.  Assessment/Plan:  [age] year old Caucasian male with past medical history of ADD and smoking. Admitted 03/15/14 with chest pain. Troponin negative. Coronary CT showed 70% LAD.  Principal Problem:   Chest pain at rest Active Problems:   CAD- LAD disease by CT   SOB (shortness of breath)   Tobacco abuse   ADD (attention deficit disorder)   PLAN: Cath today  Corine ShelterLuke Filbert Craze PA-C [employer] [phone] 03/17/2014, 9:03 AM

## 2014-03-17 NOTE — Discharge Instructions (Signed)
Chest Pain (Nonspecific) °It is often hard to give a diagnosis for the cause of chest pain. There is always a chance that your pain could be related to something serious, such as a heart attack or a blood clot in the lungs. You need to follow up with your doctor. °HOME CARE °· If antibiotic medicine was given, take it as directed by your doctor. Finish the medicine even if you start to feel better. °· For the next few days, avoid activities that bring on chest pain. Continue physical activities as told by your doctor. °· Do not use any tobacco products. This includes cigarettes, chewing tobacco, and e-cigarettes. °· Avoid drinking alcohol. °· Only take medicine as told by your doctor. °· Follow your doctor's suggestions for more testing if your chest pain does not go away. °· Keep all doctor visits you made. °GET HELP IF: °· Your chest pain does not go away, even after treatment. °· You have a rash with blisters on your chest. °· You have a fever. °GET HELP RIGHT AWAY IF:  °· You have more pain or pain that spreads to your arm, neck, jaw, back, or belly (abdomen). °· You have shortness of breath. °· You cough more than usual or cough up blood. °· You have very bad back or belly pain. °· You feel sick to your stomach (nauseous) or throw up (vomit). °· You have very bad weakness. °· You pass out (faint). °· You have chills. °This is an emergency. Do not wait to see if the problems will go away. Call your local emergency services (911 in [place] Do not drive yourself to the hospital. °MAKE SURE YOU:  °· Understand these instructions. °· Will watch your condition. °· Will get help right away if you are not doing well or get worse. °Document Released: 07/04/2007 Document Revised: 01/20/2013 Document Reviewed: 07/04/2007 °Exit[employer]® Patient Information ©2015 [employer]. This information is not intended to replace advice given to you by your health care provider. Make sure you discuss any questions you have with your  health care provider. °Radial Site Care °Refer to this sheet in the next few weeks. These instructions provide you with information on caring for yourself after your procedure. Your caregiver may also give you more specific instructions. Your treatment has been planned according to current medical practices, but problems sometimes occur. Call your caregiver if you have any problems or questions after your procedure. °HOME CARE INSTRUCTIONS °· You may shower the day after the procedure. Remove the bandage (dressing) and gently wash the site with plain soap and water. Gently pat the site dry. °· Do not apply powder or lotion to the site. °· Do not submerge the affected site in water for 3 to 5 days. °· Inspect the site at least twice daily. °· Do not flex or bend the affected arm for 24 hours. °· No lifting over 5 pounds (2.3 kg) for 5 days after your procedure. °· Do not drive home if you are discharged the same day of the procedure. Have someone else drive you. °· You may drive 24 hours after the procedure unless otherwise instructed by your caregiver. °· Do not operate machinery or power tools for 24 hours. °· A responsible adult should be with you for the first 24 hours after you arrive home. °What to expect: °· Any bruising will usually fade within 1 to 2 weeks. °· Blood that collects in the tissue (hematoma) may be painful to the touch. It should usually decrease in size   and tenderness within 1 to 2 weeks. °SEEK IMMEDIATE MEDICAL CARE IF: °· You have unusual pain at the radial site. °· You have redness, warmth, swelling, or pain at the radial site. °· You have drainage (other than a small amount of blood on the dressing). °· You have chills. °· You have a fever or persistent symptoms for more than 72 hours. °· You have a fever and your symptoms suddenly get worse. °· Your arm becomes pale, cool, tingly, or numb. °· You have heavy bleeding from the site. Hold pressure on the site. °Document Released: 02/17/2010  Document Revised: 04/09/2011 Document Reviewed: 02/17/2010 °ExitCare® Patient Information ©2015 [employer]. This information is not intended to replace advice given to you by your health care provider. Make sure you discuss any questions you have with your health care provider. ° °

## 2014-03-17 NOTE — H&P (View-Only) (Signed)
  Subjective:  No chest pian like he had on admission ("like a ball in my chest")  . amphetamine-dextroamphetamine  20 mg Oral BID  . aspirin EC  325 mg Oral Daily  . azithromycin  250 mg Oral Daily  . docusate sodium  200 mg Oral BID  . enoxaparin (LOVENOX) injection  40 mg Subcutaneous Q24H  . nicotine  21 mg Transdermal Daily  . sodium chloride  3 mL Intravenous Q12H    Objective:  Vital Signs in the last 24 hours: Temp:  [97.8 F (36.6 C)-99.8 F (37.7 C)] 97.8 F (36.6 C) (02/17 0819) Pulse Rate:  [73-89] 74 (02/17 0819) Resp:  [18-20] 18 (02/17 0504) BP: (113-126)/(67-73) 113/71 mmHg (02/17 0819) SpO2:  [99 %-100 %] 100 % (02/17 0819) Weight:  [183 lb (83.008 kg)] 183 lb (83.008 kg) (02/17 0504)  Intake/Output from previous day:  Intake/Output Summary (Last 24 hours) at 03/17/14 0852 Last data filed at 03/17/14 0827  Gross per 24 hour  Intake    425 ml  Output    975 ml  Net   -550 ml    Physical Exam: General appearance: alert, cooperative and no distress Lungs: clear to auscultation bilaterally Heart: regular rate and rhythm   Rate: 84  Rhythm: normal sinus rhythm  Lab Results:  Recent Labs  03/15/14 1310 03/15/14 1827  WBC 5.5 3.9*  HGB 14.6 13.2  PLT 177 171    Recent Labs  03/15/14 1310 03/15/14 1827 03/17/14 0527  NA 136  --  136  K 4.0  --  4.2  CL 103  --  108  CO2 26  --  25  GLUCOSE 97  --  103*  BUN 13  --  10  CREATININE 0.95 0.97 0.86    Recent Labs  03/15/14 2040 03/15/14 2327  TROPONINI <0.03 <0.03    Recent Labs  03/16/14 1718  INR 1.02    Imaging: Imaging results have been reviewed  Cardiac Studies: Coronary CTA: 1. Coronary calcium score of 236. This was 95 percentile for age and sex matched control.  2. Normal coronary origin.  3. Right dominance.  4. 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 The  degree of stenosis might be overestimated by dense calcifications.  Cardiac cath is recommended.   Echo: 03/16/2013 - Left ventricle: The cavity size was normal. Wall thickness was normal. Systolic function was normal. The estimated ejection fraction was in the range of 60% to 65%. Wall motion was normal; there were no regional wall motion abnormalities. Left ventricular diastolic function parameters were normal. - Atrial septum: No defect or patent foramen ovale was identified.  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 Troponin negative x 3, D-dimer WNL, CXR showed mild bronchitic changes.   Principal Problem:   Chest pain at rest Active Problems:   Tobacco abuse   ADD (attention deficit disorder)   Bronchitis   SOB (shortness of breath)   A pleasant 48 year old male who presented with non-exertional sharp chest pain associated with SOB relieved by NTG and significant exertional fatigue x 3 months, ongoing smoker with abnormal coronary CTA with a long moderate calcified plaque in the proximal to mid LAD associated with 70 % stenosis in its distal portion. The degree of stenosis might be overestimated by dense calcifications.  Regardless of   cath results he will need intense medical therapy  - stop smoking - ASA indefinitely - start atorvastatin 40 mg po daily - start carvedilol 3.125 mg po BID   Alan Odonnell H 03/17/2014   

## 2014-03-18 DIAGNOSIS — R079 Chest pain, unspecified: Secondary | ICD-10-CM | POA: Insufficient documentation

## 2014-03-18 DIAGNOSIS — I2510 Atherosclerotic heart disease of native coronary artery without angina pectoris: Secondary | ICD-10-CM

## 2014-03-18 DIAGNOSIS — I2584 Coronary atherosclerosis due to calcified coronary lesion: Secondary | ICD-10-CM

## 2014-03-18 MED ORDER — ASPIRIN 81 MG PO TABS: 81.0000 mg | ORAL_TABLET | Freq: Every day | ORAL | Status: AC

## 2014-03-18 MED ORDER — FLUTICASONE PROPIONATE 50 MCG/ACT NA SUSP: 1.0000 | Freq: Every day | NASAL | Status: AC

## 2014-03-18 MED ORDER — ATORVASTATIN CALCIUM 40 MG PO TABS: 40.0000 mg | ORAL_TABLET | Freq: Every day | ORAL | Status: DC

## 2014-03-18 MED ORDER — NICOTINE 7 MG/24HR TD PT24: 7.0000 mg | MEDICATED_PATCH | Freq: Every day | TRANSDERMAL | Status: DC

## 2014-03-18 MED ORDER — CARVEDILOL 3.125 MG PO TABS: 3.1250 mg | ORAL_TABLET | Freq: Two times a day (BID) | ORAL | Status: DC

## 2014-03-18 MED ORDER — NICOTINE 14 MG/24HR TD PT24: 14.0000 mg | MEDICATED_PATCH | Freq: Every day | TRANSDERMAL | Status: DC

## 2014-03-18 NOTE — Progress Notes (Addendum)
    Subjective:  NO chest pain.  Marland Kitchen. amphetamine-dextroamphetamine  20 mg Oral BID  . aspirin EC  325 mg Oral Daily  . atorvastatin  40 mg Oral q1800  . azithromycin  250 mg Oral Daily  . carvedilol  3.125 mg Oral BID WC  . docusate sodium  200 mg Oral BID  . nicotine  21 mg Transdermal Daily    Objective:  Vital Signs in the last 24 hours: Temp:  [97.6 F (36.4 C)-98.6 F (37 C)] 97.6 F (36.4 C) (02/18 0400) Pulse Rate:  [66-87] 80 (02/18 0400) BP: (102-130)/(58-79) 102/58 mmHg (02/18 0400) SpO2:  [95 %-100 %] 99 % (02/18 0400)  Intake/Output from previous day:  Intake/Output Summary (Last 24 hours) at 03/18/14 1005 Last data filed at 03/18/14 0646  Gross per 24 hour  Intake    360 ml  Output    750 ml  Net   -390 ml    Physical Exam: General appearance: alert, cooperative and no distress Lungs: clear to auscultation bilaterally Heart: regular rate and rhythm   Rate: 84  Rhythm: normal sinus rhythm  Lab Results:  Recent Labs  03/15/14 1310 03/15/14 1827  WBC 5.5 3.9*  HGB 14.6 13.2  PLT 177 171    Recent Labs  03/15/14 1310 03/15/14 1827 03/17/14 0527  NA 136  --  136  K 4.0  --  4.2  CL 103  --  108  CO2 26  --  25  GLUCOSE 97  --  103*  BUN 13  --  10  CREATININE 0.95 0.97 0.86    Recent Labs  03/15/14 2040 03/15/14 2327  TROPONINI <0.03 <0.03    Recent Labs  03/16/14 1718  INR 1.02    Imaging: Imaging results have been reviewed  Cardiac Studies: Coronary CTA: 1. Coronary calcium score of 236. This was 95 percentile for age and sex matched control.  2. Normal coronary origin.  3. Right dominance.  4. 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 The degree of stenosis might be overestimated by dense calcifications.  Cardiac cath is recommended.   Echo: 03/16/2013 - Left ventricle: The cavity size was normal. Wall thickness  was normal. Systolic function was normal. The estimated ejection fraction was in the range of 60% to 65%. Wall motion was normal; there were no regional wall motion abnormalities. Left ventricular diastolic function parameters were normal. - Atrial septum: No defect or patent foramen ovale was identified.  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 Troponin negative x 3, D-dimer WNL, CXR showed mild bronchitic changes.   Principal Problem:   Chest pain at rest Active Problems:   Tobacco abuse   ADD (attention deficit disorder)   SOB (shortness of breath)   CAD- LAD disease by CT   A pleasant 49 year old male who presented with non-exertional sharp chest pain associated with SOB relieved by NTG and significant exertional fatigue x 3 months, ongoing smoker with abnormal coronary CTA with a long moderate calcified plaque in the proximal to mid LAD, cath showed 40% stenosis.  Echo was normal.  He will need intense medical therapy  - stop smoking - ASA indefinitely - start atorvastatin 40 mg po daily - start carvedilol 3.125 mg po BID   Lars MassonELSON, Yadira Hada H 03/18/2014

## 2014-03-18 NOTE — Discharge Summary (Addendum)
Discharge Summary  Alan Odonnell ZOX:[id number] DOB: [birth year]  PCP: Pcp Not In System  Admit date: 03/15/2014 Discharge date: 03/18/2014  Time spent: less than  Recommendations for Outpatient Follow-up:  1. F/y with pmd in 2wks 2. F/u with cardiology  Discharge Diagnoses:  Active Hospital Problems   Diagnosis Date Noted  . Chest pain at rest 03/15/2014  . Pain in the chest   . CAD- LAD disease by CT 03/17/2014  . SOB (shortness of breath)   . Tobacco abuse 03/15/2014  . ADD (attention deficit disorder) 03/15/2014    Resolved Hospital Problems   Diagnosis Date Noted Date Resolved  No resolved problems to display.    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 He comes to the ER today with chest pain at rest. Alan Odonnell reports the last 2 to 3 days he's had mild cough and shortness of breath. He went to work this morning and noticed an abnormal feeling in the left side of his chest but ignored it. Approximately lunchtime he developed a severe squeezing pain in the left side of his chest that radiated up to his left shoulder blade. The patient states he is never had pain like that before, and this very severe. It lasted over 30 minutes without any relief. During this time frame he had significant diaphoresis and dizziness. The pain was relieved by nitroglycerin in the ambulance, but recurred again in the emergency department. Alan Odonnell smokes one half to one pack of cigarettes a day, and has on and off over the last 30 years. He drinks 2 bourbon and cokes every evening. in the ER his pain has changed. He now mainly has stabbing left chest pain when he takes a deep inspiration.  Alan Odonnell relates that his mother had her 1st heart attack at 24 and has had multiple strokes, she also has valvular disease.   In the ER troponin is normal, chest x-ray shows mild bronchitic changes. EKG show shows sinus tach. Otherwise serologies including a D dimer are normal. Alan Odonnell will be admitted for ACS rule Odonnell.   Hospital Course:  Principal Problem:   Chest pain at rest Active Problems:   Tobacco abuse   ADD (attention deficit disorder)   SOB (shortness of breath)   CAD- LAD disease by CT   Pain in the chest  Chest pain with risk factor including smoking, family history. Cardiology consulted, coronary CTA showed mid lad lesion. Cardiac cath 2/17,  1. Mild to moderate nonobstructive coronary artery disease with calcified 40% stenosis in the proximal LAD. 2. Normal LV systolic function. On asa 81, lipid panel ldl 113, start lipitor  po qd. Coreg 3.125 po bid Cardiology follow up  Wheezing: no wheezing this am. Early emphsematous changes on CTA.received zithromax in the hospital. Does has nasal congestion, flonase provided,  Smoker: smoking cessation euducation provided, nicotine patch provided  Psych: stable on adderall  Procedures:  Cardiac cath 03/17/2014  Consultations:  cardiology  Discharge Exam: BP 102/58 mmHg  Pulse 80  Temp(Src) 97.6 F (36.4 C) (Oral)  Resp 18  Ht  (1.753 m)  Wt 83.008 kg (183 lb)  BMI 27.01 kg/m2  SpO2 99%  General: well-developed, well-nourished male, lying in bed in NAD,   Psych: Normal affect and insight, Not Suicidal or Homicidal, Awake Alert, Oriented X 3.  Neuro: No F.N deficits, ALL C.Nerves Intact, Strength 5/5 all 4 extremities, Sensation intact all 4 extremities.  ENT: Ears and Eyes appear Normal, Conjunctivae clear, PER. Moist oral mucosa without erythema or exudates.  Neck: Supple, No lymphadenopathy appreciated  Respiratory: Symmetrical chest wall movement, Good air movement bilaterally,  CTAB.  Cardiac: RRR, No Murmurs, no LE edema noted, no JVD.   Abdomen: Positive bowel sounds, Soft, Non tender, Non distended, No masses appreciated  Skin: No Cyanosis, Normal Skin Turgor, No Skin Rash or Bruise.  Extremities: Able to move all 4. 5/5 strength in each, no effusions.   Discharge Instructions You were cared for by a hospitalist during your hospital stay. If you have any questions about your discharge medications or the care you received while you were in the hospital after you are discharged, you can call the unit and asked to speak with the hospitalist on call if the hospitalist that took care of you is not available. Once you are discharged, your primary care physician will handle any further medical issues. Please note that NO REFILLS for any discharge medications will be authorized once you are discharged, as it is imperative that you return to your primary care physician (or establish a relationship with a primary care physician if you do not have one) for your aftercare needs so that they can reassess your need for medications and monitor your lab values.      Discharge Instructions    Diet - low sodium heart healthy    Complete by:  As directed      Increase activity slowly    Complete by:  As directed             Medication List    TAKE these medications        amphetamine-dextroamphetamine 20 MG tablet  Commonly known as:  ADDERALL  Take 20 mg by mouth 2 (two) times daily.     aspirin 81 MG tablet  Take 1 tablet (81 mg total) by mouth daily.     atorvastatin 40 MG tablet  Commonly known as:  LIPITOR  Take 1 tablet (40 mg total) by mouth daily at 6 PM.     carvedilol 3.125 MG tablet  Commonly known as:  COREG  Take 1 tablet (3.125 mg total) by mouth 2 (two) times daily with a meal.     fluticasone 50 MCG/ACT nasal spray  Commonly known as:  FLONASE  Place 1 spray into both nostrils daily.     nicotine 14 mg/24hr patch  Commonly known as:   NICODERM CQ  Place 1 patch (14 mg total) onto the skin daily.     nicotine 7 mg/24hr patch  Commonly known as:  NICODERM CQ  Place 1 patch (7 mg total) onto the skin daily.  Start taking on:  04/30/2014       Allergies  Allergen Reactions  . Rofecoxib     unknown   Follow-up Information    Follow up with Lars Masson, MD On 04/22/2014.   Specialty:  Cardiology   Why:  11:15am   Contact information:   [address] [place] [address] [phone]        The results of significant diagnostics from this hospitalization (including imaging, microbiology, ancillary and laboratory) are listed below for reference.  Significant Diagnostic Studies: Dg Chest 2 View  03/15/2014   CLINICAL DATA:  Left-sided chest pain starting today.  EXAM: CHEST  2 VIEW  COMPARISON:  None.  FINDINGS: There is mild interstitial coarsening from bronchial cuffing. There is no edema, consolidation, effusion, or pneumothorax. Normal heart size and mediastinal contours. No osseous findings to explain chest pain.  IMPRESSION: 1. Mild bronchitic changes. 2. No explanation for left chest pain.   Electronically Signed   By: Marnee SpringJonathon  Watts M.D.   On: 03/15/2014 14:24   Ct Coronary Morp W/cta Cor W/score W/ca W/cm &/or Wo/cm  03/16/2014   ADDENDUM REPORT: 03/16/2014 15:06  CLINICAL DATA:  49 year old smoker with chest pain  EXAM: Cardiac/Coronary  CT  TECHNIQUE: The patient was scanned on a Philips 256 scanner.  FINDINGS: A 120 kV prospective scan was triggered in the descending thoracic aorta at 111 HU's. Axial non-contrast 3 mm slices were carried Odonnell through the heart. The data set was analyzed on a dedicated work station and scored using the Agatson method. Gantry rotation speed was 270 msecs and collimation was .9 mm. 10 mg of iv Metoprolol and 0.8 mg of sl NTG was given. The 3D data set was reconstructed in 5% intervals of the 67-82 % of the R-R cycle. Diastolic phases were analyzed on a  dedicated work station using MPR, MIP and VRT modes. The patient received 80 cc of contrast.  Aorta: Normal caliber, minimal calcification in the aortic arch, no dissection.  Aortic Valve:  Trileaflet, no calcifications.  Coronary Arteries:  Normal origin.  Right dominance.  RCA is a large caliber dominant vessel that gives rise to RV acute marginal branch and fairly large PDA and PLA arteries. There is minimal calcified plaque in the proximal RCA associated with 0-25% stenosis. The rest of RCA has no plaque.  Left main is free of plaque and gives rise to LAD, ramus intermedius and LCX arteries.  LAD is a large caliber, long vessel that wraps around the apex. It gives rise to one diagonal branch.  LAD has a long moderate calcified plaque starting in the proximal and extending to mid LAD associated with 50-69% stenosis, however at the distal portion seems to be severe with stenosis equal or more than 70%. The remaining LAD has only minimal noncalcified plaque.  Diagonal artery has mild noncalcified ostial plaque associated with 25-50% stenosis.  Ramus intermedius is medium caliber vessel that has no significant plaque.  LCX artery is a small caliber non-dominant vessel that gives rise to one obtuse marginal branch. There is no plaque.  Other findings:  Normal size of the pulmonary artery. Normal drainage of the pulmonary veins. Small left atrial appendage with no filling defect. No other anomalies were identified.  IMPRESSION: 1. Coronary calcium score of 236. This was 95 percentile for age and sex matched control.  2.  Normal coronary origin.  3.  Right dominance.  4. 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 The degree of stenosis might be overestimated by dense calcifications.  Cardiac cath is recommended.  Tobias AlexanderKatarina Nelson   Electronically Signed   By: Tobias AlexanderKatarina  Nelson   On: 03/16/2014 15:06   03/16/2014   EXAM: OVER-READ  INTERPRETATION  CT CHEST  The following report is an over-read performed by radiologist Dr. Donnal MoatP. [facility]aniof [facility] on 03/16/2014. This over-read does not include interpretation of cardiac or coronary anatomy or pathology.  The coronary calcium score/coronary CTA interpretation by the cardiologist is attached.  COMPARISON:  None.  FINDINGS: Chest wall: The visualized chest wall is unremarkable. No soft tissue lesions or significant bony findings.  Mediastinum: Scattered small mediastinal and hilar lymph nodes but no mass or overt adenopathy. The aorta is normal in caliber. The esophagus is grossly normal.  Lungs/pleura: Dependent bibasilar atelectasis is noted but no worrisome pulmonary nodules or mass lesions. Scattered calcified granulomas are noted. Mild/early emphysematous changes. No bronchiectasis or interstitial lung disease.  IMPRESSION: Early emphysematous changes.  Scattered calcified granulomas but no worrisome pulmonary lesions.  Electronically Signed: By: Rudie Meyer M.D. On: 03/16/2014 12:18    Microbiology: No results found for this or any previous visit (from the past 240 hour(s)).   Labs: Basic Metabolic Panel:  Recent Labs Lab 03/15/14 1310 03/15/14 1827 03/17/14 0527  NA 136  --  136  K 4.0  --  4.2  CL 103  --  108  CO2 26  --  25  GLUCOSE 97  --  103*  BUN 13  --  10  CREATININE 0.95 0.97 0.86  CALCIUM 9.0  --  8.2*   Liver Function Tests: No results for input(s): AST, ALT, ALKPHOS, BILITOT, PROT, ALBUMIN in the last 168 hours. No results for input(s): LIPASE, AMYLASE in the last 168 hours. No results for input(s): AMMONIA in the last 168 hours. CBC:  Recent Labs Lab 03/15/14 1310 03/15/14 1827  WBC 5.5 3.9*  HGB 14.6 13.2  HCT 43.2 40.0  MCV 88.9 91.1  PLT 177 171   Cardiac Enzymes:  Recent Labs Lab 03/15/14 1827 03/15/14 2040 03/15/14 2327 03/17/14 1450  CKTOTAL  --   --   --  77  CKMB  --   --   --  1.3  TROPONINI <0.03 <0.03  <0.03  --    BNP: BNP (last 3 results)  Recent Labs  03/15/14 1310  BNP 7.4    ProBNP (last 3 results) No results for input(s): PROBNP in the last 8760 hours.  CBG: No results for input(s): GLUCAP in the last 168 hours.     Signed:  Kathleene Bergemann  [facility] 03/18/2014, 11:16 AM

## 2014-03-18 NOTE — Progress Notes (Signed)
UR completed 

## 2014-03-18 NOTE — Plan of Care (Signed)
Problem: Phase I Progression Outcomes Goal: Aspirin unless contraindicated Outcome: Completed/Met Date Met:  03/18/14 Given per EMS per prior RN documentation per patient report

## 2014-04-21 ENCOUNTER — Encounter: Payer: Self-pay | Admitting: *Deleted

## 2014-04-22 ENCOUNTER — Ambulatory Visit: Payer: Managed Care, Other (non HMO) | Admitting: Cardiology

## 2014-04-22 ENCOUNTER — Encounter: Payer: Self-pay | Admitting: Cardiology

## 2014-04-22 ENCOUNTER — Ambulatory Visit (INDEPENDENT_AMBULATORY_CARE_PROVIDER_SITE_OTHER): Payer: Managed Care, Other (non HMO) | Admitting: Cardiology

## 2014-04-22 VITALS — BP 120/90 | HR 75 | Ht 69.0 in | Wt 188.0 lb

## 2014-04-22 DIAGNOSIS — I2510 Atherosclerotic heart disease of native coronary artery without angina pectoris: Secondary | ICD-10-CM

## 2014-04-22 DIAGNOSIS — I10 Essential (primary) hypertension: Secondary | ICD-10-CM

## 2014-04-22 DIAGNOSIS — R072 Precordial pain: Secondary | ICD-10-CM | POA: Diagnosis not present

## 2014-04-22 DIAGNOSIS — E785 Hyperlipidemia, unspecified: Secondary | ICD-10-CM | POA: Insufficient documentation

## 2014-04-22 DIAGNOSIS — I2583 Coronary atherosclerosis due to lipid rich plaque: Secondary | ICD-10-CM

## 2014-04-22 HISTORY — DX: Essential (primary) hypertension: I10

## 2014-04-22 LAB — COMPREHENSIVE METABOLIC PANEL
ALT: 34 U/L (ref 0–53)
AST: 23 U/L (ref 0–37)
Albumin: 4.1 g/dL (ref 3.5–5.2)
Alkaline Phosphatase: 47 U/L (ref 39–117)
BUN: 17 mg/dL (ref 6–23)
CO2: 29 mEq/L (ref 19–32)
Calcium: 9.1 mg/dL (ref 8.4–10.5)
Chloride: 103 mEq/L (ref 96–112)
Creatinine, Ser: 0.9 mg/dL (ref 0.40–1.50)
GFR: 94.91 mL/min (ref >60.00)
Glucose, Bld: 76 mg/dL (ref 70–99)
Potassium: 3.9 mEq/L (ref 3.5–5.1)
Sodium: 137 mEq/L (ref 135–145)
Total Bilirubin: 0.5 mg/dL (ref 0.2–1.2)
Total Protein: 6.6 g/dL (ref 6.0–8.3)

## 2014-04-22 MED ORDER — PANTOPRAZOLE SODIUM 40 MG PO TBEC: 40.0000 mg | DELAYED_RELEASE_TABLET | Freq: Every day | ORAL | Status: DC

## 2014-04-22 NOTE — Patient Instructions (Signed)
Your physician has recommended you make the following change in your medication:   START TAKING PROTONIX 40 MG ONCE DAILY     Your physician recommends that you return for lab work in: TODAY---CMET AND NMR W LIPIDS      Your physician wants you to follow-up in: 6 MONTHS WITH DR Johnell ComingsNELSON You will receive a reminder letter in the mail two months in advance. If you don't receive a letter, please call our office to schedule the follow-up appointment.

## 2014-04-22 NOTE — Progress Notes (Signed)
Patient ID: Alan LymeDavid Kopplin, male   DOB: 09/25/1965, 49 y.o.   MRN: [id number]      Cardiology Office Note  Date:  04/22/2014   ID:  Alan Lymeavid Slaven, DOB 09/25/1965, MRN [id number]  PCP:  Pcp Not In System  Cardiologist: Lars MassonNELSON, Ariyonna Twichell H, MD   No chief complaint on file.   History of Present Illness: Alan Odonnell is a [age] y.o. male who presented to the ER on 03/18/2014 with CP at rest, he had h/opremature CAD in family, HTN, Tobacco abuse and worsening DOE. Coronary CT showed calcium score of 236 and significant LAD lesion. The patient underwent Cardiac cath on 2/17 that showed  Mild to moderate nonobstructive coronary artery disease with calcified 40% stenosis in the proximal LAD. Normal LV systolic function. On asa 81, lipid panel ldl 113, start lipitor 40mg  po qd. Coreg 3.125 po bid.  Today he states that he started to exercise again, he quit smoking and is very careful about counting his dietary sodium. He denies chest pain, DOE, palpitations. He has no muscle or joint pain.   Past Medical History  Diagnosis Date  . ADD (attention deficit disorder)   . DDD (degenerative disc disease), lumbar   . Exposure to TB     Past Surgical History  Procedure Laterality Date  . Back surgery    . Left heart catheterization with coronary angiogram N/A 03/17/2014    Procedure: LEFT HEART CATHETERIZATION WITH CORONARY ANGIOGRAM;  Surgeon: Iran OuchMuhammad A Arida, MD;  Location: [facility];  Service: Cardiovascular;  Laterality: N/A;    Current Outpatient Prescriptions  Medication Sig Dispense Refill  . amphetamine-dextroamphetamine (ADDERALL) 20 MG tablet Take 20 mg by mouth 2 (two) times daily.    Marland Kitchen. aspirin 81 MG tablet Take 1 tablet (81 mg total) by mouth daily. 30 tablet 3  . atorvastatin (LIPITOR) 40 MG tablet Take 1 tablet (40 mg total) by mouth daily at 6 PM. 30 tablet 0  . carvedilol (COREG) 3.125 MG tablet Take 1 tablet (3.125 mg total) by mouth 2 (two) times daily with a meal. 60 tablet 5  .  fluticasone (FLONASE) 50 MCG/ACT nasal spray Place 1 spray into both nostrils daily. 1 g 2  . nicotine (NICODERM CQ) 14 mg/24hr patch Place 1 patch (14 mg total) onto the skin daily. 42 patch 0  . [START ON 04/30/2014] nicotine (NICODERM CQ) 7 mg/24hr patch Place 1 patch (7 mg total) onto the skin daily. 14 patch 0   No current facility-administered medications for this visit.    Allergies:   Rofecoxib    Social History:  The patient  reports that he has been smoking Cigarettes.  He does not have any smokeless tobacco history on file. He reports that he drinks alcohol. He reports that he does not use illicit drugs.   Family History:  The patient's family history includes Heart attack in his mother; Stroke in his mother; Suicidality (age of onset: 5945) in his father.    ROS:  Please see the history of present illness.   All other systems are reviewed and negative.    PHYSICAL EXAM: VS:  BP 120/90 mmHg  Pulse 75  Ht 5\' 9"  (1.753 m)  Wt 188 lb (85.276 kg)  BMI 27.75 kg/m2 , BMI Body mass index is 27.75 kg/(m^2). GEN: Well nourished, well developed, in no acute distress HEENT: normal Neck: no JVD, carotid bruits, or masses Cardiac: RRR; no murmurs, rubs, or gallops,no edema  Respiratory:  clear to auscultation bilaterally,  normal work of breathing GI: soft, nontender, nondistended, + BS MS: no deformity or atrophy Skin: warm and dry, no rash Neuro:  Strength and sensation are intact Psych: euthymic mood, full affect   EKG:  EKG is ordered today. The ekg ordered today demonstrates NSR,  Normal ECG.   Recent Labs: 03/15/2014: B Natriuretic Peptide 7.4; Hemoglobin 13.2; Platelets 171 03/16/2014: TSH 0.658 03/17/2014: BUN 10; Creatinine 0.86; Potassium 4.2; Sodium 136    Lipid Panel    Component Value Date/Time   CHOL 175 03/15/2014 1827   TRIG 109 03/15/2014 1827   HDL 40 03/15/2014 1827   CHOLHDL 4.4 03/15/2014 1827   VLDL 22 03/15/2014 1827   LDLCALC 113* 03/15/2014 1827       Wt Readings from Last 3 Encounters:  04/22/14 188 lb (85.276 kg)  03/17/14 183 lb (83.008 kg)  01/03/11 188 lb (85.276 kg)      Other studies Reviewed: Additional studies/ records that were reviewed today include: left cardiac cath, TTE  TTE: 03/16/2014 - Left ventricle: The cavity size was normal. Wall thickness was normal. Systolic function was normal. The estimated ejection fraction was in the range of 60% to 65%. Wall motion was normal; there were no regional wall motion abnormalities. Left ventricular diastolic function parameters were normal. - Atrial septum: No defect or patent foramen ovale was identified.   ASSESSMENT AND PLAN:  49 year old male  1. CAD, non-obstructive - continue ASA, carvedilol, atorvastatin - quit smoking - encourage to continue with Mediterranean diet  2. Hypertension - now controlled  3. Hyperlipidemia - on atorvastatin, we will check LFTs today  4. Smoking - quit  Follow up in 6 months.  Signed, Lars Masson, MD  04/22/2014 11:33 AM    [facility] [address] [place], [place], [place]  [address] Phone: [phone]; Fax: [phone]

## 2014-04-24 LAB — NMR LIPOPROFILE WITH LIPIDS
Cholesterol, Total: 139 mg/dL (ref 100–199)
HDL Particle Number: 35 umol/L (ref >=30.5)
HDL Size: 9.3 nm (ref >=9.2)
HDL-C: 47 mg/dL (ref >39)
LDL (calc): 66 mg/dL (ref 0–99)
LDL Particle Number: 824 nmol/L (ref <1000)
LDL Size: 21 nm (ref >=20.8)
LP-IR Score: 43 (ref <=45)
Large HDL-P: 5.8 umol/L (ref >=4.8)
Large VLDL-P: 2.1 nmol/L (ref <=2.7)
Small LDL Particle Number: 344 nmol/L (ref <=527)
Triglycerides: 129 mg/dL (ref 0–149)
VLDL Size: 46.6 nm (ref <=46.6)

## 2014-04-27 ENCOUNTER — Telehealth: Payer: Self-pay | Admitting: *Deleted

## 2014-04-27 MED ORDER — ATORVASTATIN CALCIUM 40 MG PO TABS: 40.0000 mg | ORAL_TABLET | Freq: Every day | ORAL | Status: DC

## 2014-04-27 NOTE — Telephone Encounter (Signed)
Pt requesting refill on his atorvastatin 40 mg po daily for a 90 day supply [facility].  Sent pts refill request to pharmacy of choice.

## 2014-06-11 ENCOUNTER — Other Ambulatory Visit: Payer: Self-pay

## 2014-06-11 MED ORDER — ATORVASTATIN CALCIUM 40 MG PO TABS: 40.0000 mg | ORAL_TABLET | Freq: Every day | ORAL | Status: DC

## 2014-06-11 MED ORDER — ATORVASTATIN CALCIUM 40 MG PO TABS: 40.0000 mg | ORAL_TABLET | Freq: Every day | ORAL | Status: AC

## 2014-06-11 MED ORDER — CARVEDILOL 3.125 MG PO TABS: 3.1250 mg | ORAL_TABLET | Freq: Two times a day (BID) | ORAL | Status: AC

## 2014-06-11 MED ORDER — PANTOPRAZOLE SODIUM 40 MG PO TBEC: 40.0000 mg | DELAYED_RELEASE_TABLET | Freq: Every day | ORAL | Status: AC

## 2014-06-15 ENCOUNTER — Other Ambulatory Visit: Payer: Self-pay | Admitting: *Deleted

## 2014-09-02 ENCOUNTER — Telehealth: Payer: Self-pay | Admitting: Cardiology

## 2014-09-02 NOTE — Telephone Encounter (Signed)
Pt calling to ask Dr Delton See if she would refill his 2 step nicotine patches, for he has unfortunately started back smoking.  Informed the pt that Dr Delton See is out of the office at this time, but I will route this message to her for further review and recommendation of nicotine patch refill, and follow-up with him thereafter.  Pt verbalized understanding and agrees with this plan.

## 2014-09-02 NOTE — Telephone Encounter (Signed)
New message    Pt is wanting to know if doctor will prescription for nicotine patch Please call to discuss

## 2014-09-03 MED ORDER — NICOTINE 14 MG/24HR TD PT24: 14.0000 mg | MEDICATED_PATCH | Freq: Every day | TRANSDERMAL | Status: AC

## 2014-09-03 MED ORDER — NICOTINE 7 MG/24HR TD PT24: 7.0000 mg | MEDICATED_PATCH | Freq: Every day | TRANSDERMAL | Status: AC

## 2014-09-03 MED ORDER — NICOTINE 21 MG/24HR TD PT24: 21.0000 mg | MEDICATED_PATCH | Freq: Every day | TRANSDERMAL | Status: AC

## 2014-09-03 NOTE — Telephone Encounter (Signed)
Yes, we can refill

## 2014-09-03 NOTE — Telephone Encounter (Signed)
Left message for the pt to call back on Monday during business hours to inform him that per Dr Delton See and our PharmD Kennon Rounds, the pt have refills of his nicotine patch and should start by taking the 21 mg Nicoderm CQ patch for 14 days, then take the 14 mg Nicoderm CQ patch for 14 days, and then end with the 7 mg Nicoderm CQ patch for 14 days, for this is based off of the amount of cigarettes the pt reports he smokes.  The pt reports he smokes more than a half a pack of cigarettes a day.  Will continue to follow-up with the pt.

## 2014-09-14 NOTE — Telephone Encounter (Signed)
Made several attempts at contacting the pt to endorse refill of requested nicotine patches per Dr Delton See, and no return call back received yet from the pt.  Will close this encounter and defer back to it, if the pt returns our call back. Correct Nicotine dosing was sent to pts pharmacy on 09/03/14.

## 2016-12-26 IMAGING — DX DG CHEST 2V
2 series · 2 of 2 positions shown · non-contrast
Comparison: None.

CLINICAL DATA: Left-sided chest pain starting today.

EXAM:
CHEST  2 VIEW

[chest pa]
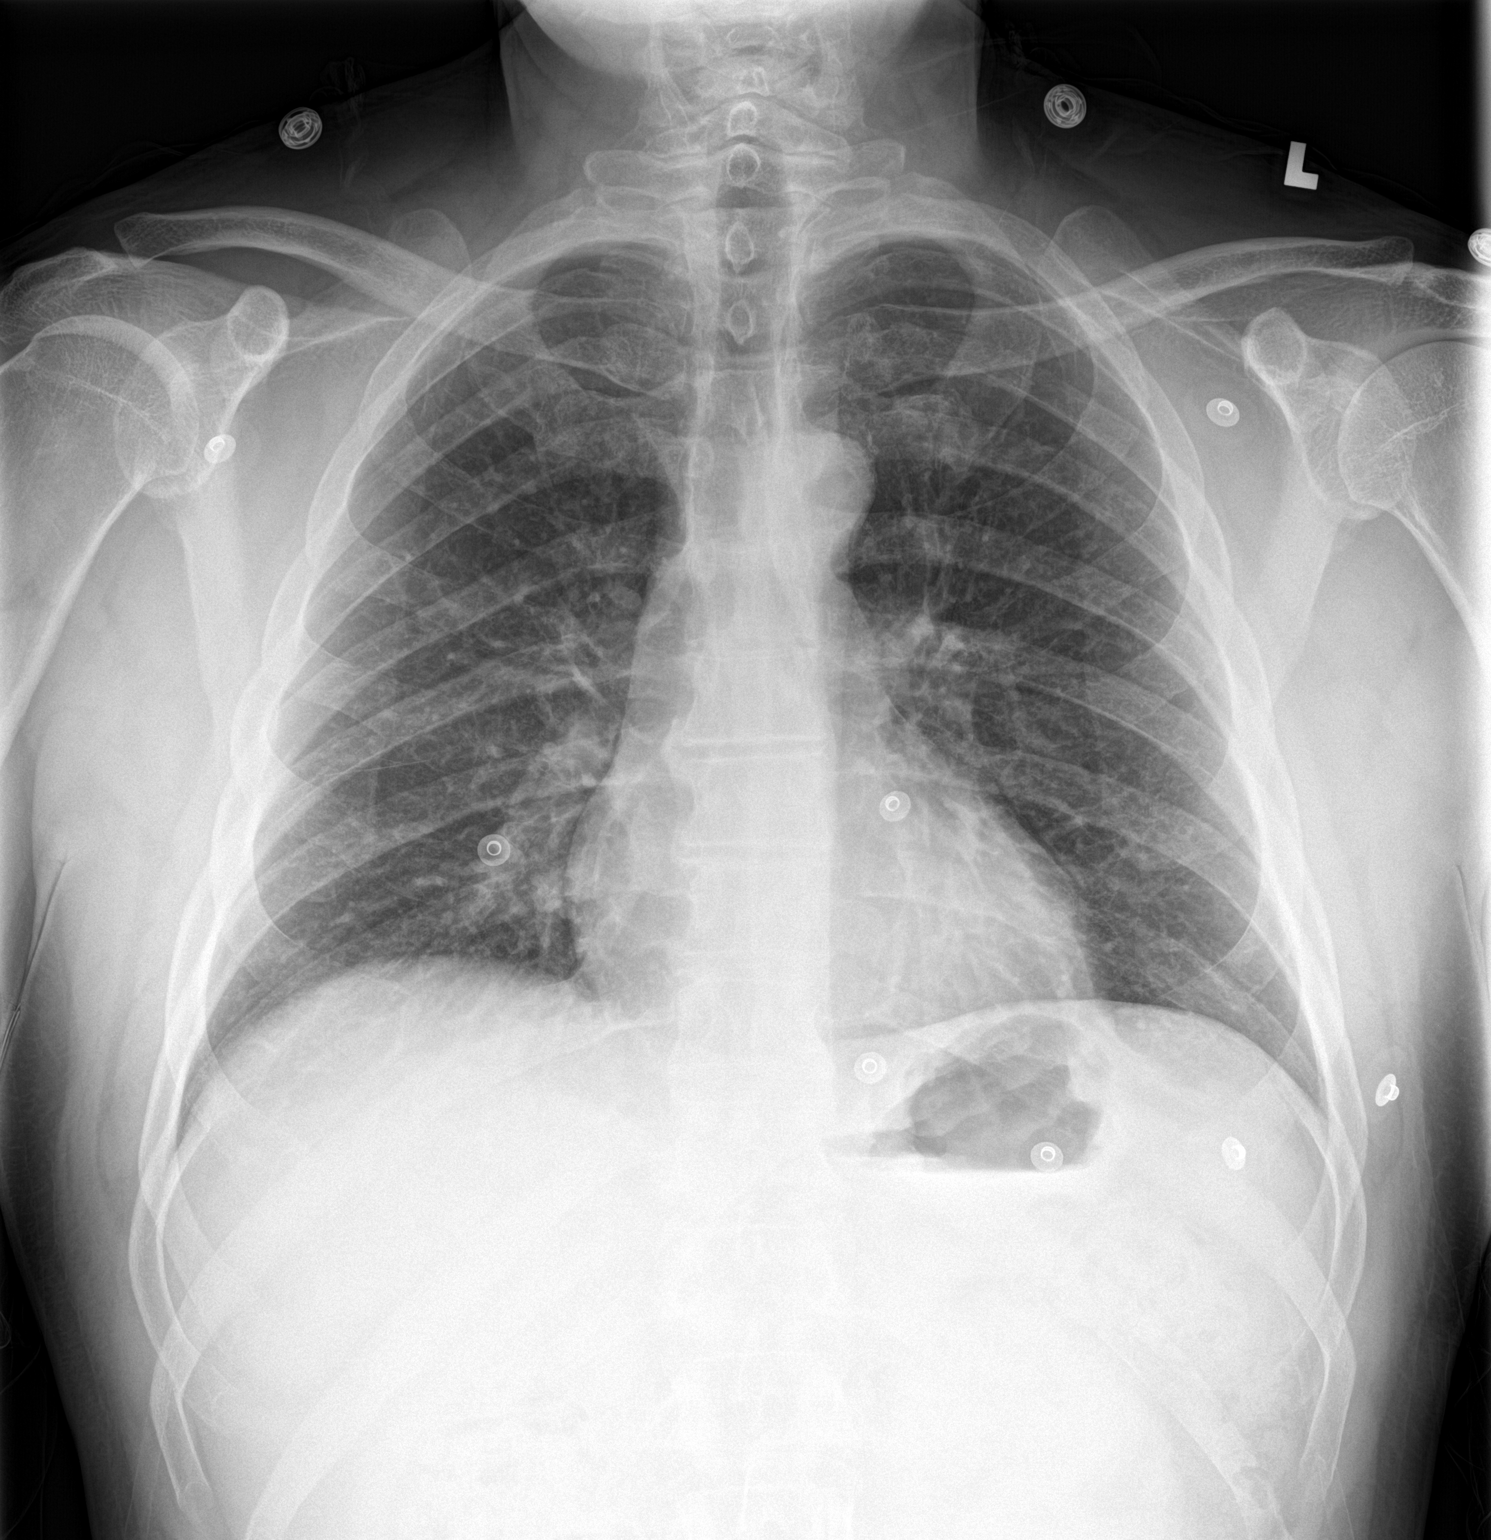

[chest lat]
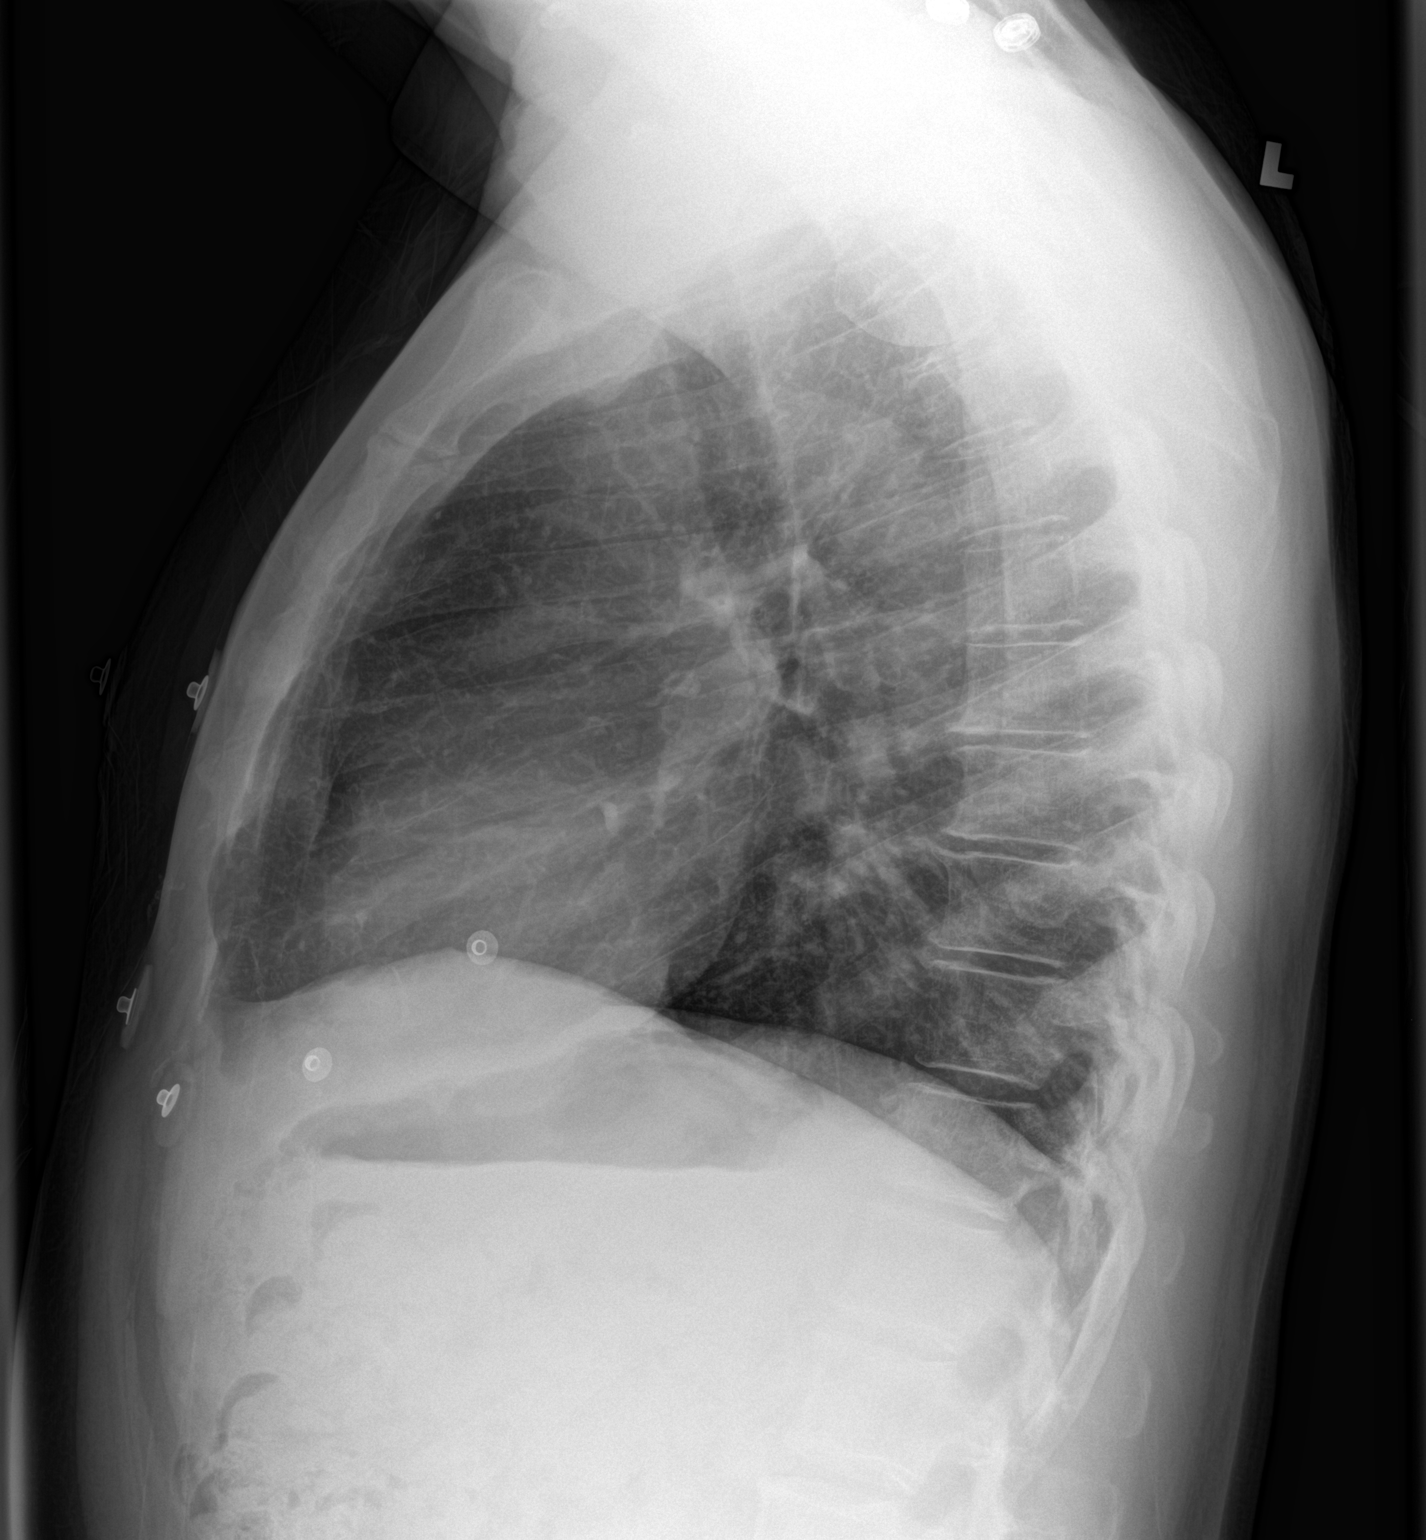

[2 of 2 positions shown; findings below may reference images not displayed]

FINDINGS: There is mild interstitial coarsening from bronchial cuffing. There
is no edema, consolidation, effusion, or pneumothorax. Normal heart
size and mediastinal contours. No osseous findings to explain chest
pain.
IMPRESSION: 1. Mild bronchitic changes.
2. No explanation for left chest pain.

## 2018-11-10 ENCOUNTER — Encounter (HOSPITAL_BASED_OUTPATIENT_CLINIC_OR_DEPARTMENT_OTHER): Payer: Self-pay | Admitting: Emergency Medicine

## 2018-11-10 ENCOUNTER — Emergency Department (HOSPITAL_BASED_OUTPATIENT_CLINIC_OR_DEPARTMENT_OTHER): Payer: No Typology Code available for payment source

## 2018-11-10 ENCOUNTER — Emergency Department (HOSPITAL_BASED_OUTPATIENT_CLINIC_OR_DEPARTMENT_OTHER)
Admission: EM | Admit: 2018-11-10 | Discharge: 2018-11-10 | Disposition: A | Discharge status: Other Acute Inpatient Hospital | Payer: No Typology Code available for payment source | Attending: Emergency Medicine | Admitting: Emergency Medicine

## 2018-11-10 ENCOUNTER — Other Ambulatory Visit: Payer: Self-pay

## 2018-11-10 DIAGNOSIS — Z79899 Other long term (current) drug therapy: Secondary | ICD-10-CM | POA: Diagnosis not present

## 2018-11-10 DIAGNOSIS — Y69 Unspecified misadventure during surgical and medical care: Secondary | ICD-10-CM | POA: Diagnosis not present

## 2018-11-10 DIAGNOSIS — I10 Essential (primary) hypertension: Secondary | ICD-10-CM | POA: Diagnosis not present

## 2018-11-10 DIAGNOSIS — Z9861 Coronary angioplasty status: Secondary | ICD-10-CM | POA: Diagnosis not present

## 2018-11-10 DIAGNOSIS — M659 Synovitis and tenosynovitis, unspecified: Secondary | ICD-10-CM

## 2018-11-10 DIAGNOSIS — M79641 Pain in right hand: Secondary | ICD-10-CM | POA: Diagnosis present

## 2018-11-10 DIAGNOSIS — Z886 Allergy status to analgesic agent status: Secondary | ICD-10-CM | POA: Diagnosis not present

## 2018-11-10 DIAGNOSIS — E785 Hyperlipidemia, unspecified: Secondary | ICD-10-CM | POA: Diagnosis not present

## 2018-11-10 DIAGNOSIS — Z20828 Contact with and (suspected) exposure to other viral communicable diseases: Secondary | ICD-10-CM | POA: Insufficient documentation

## 2018-11-10 DIAGNOSIS — T8149XA Infection following a procedure, other surgical site, initial encounter: Secondary | ICD-10-CM | POA: Diagnosis not present

## 2018-11-10 DIAGNOSIS — Z87891 Personal history of nicotine dependence: Secondary | ICD-10-CM | POA: Insufficient documentation

## 2018-11-10 DIAGNOSIS — L089 Local infection of the skin and subcutaneous tissue, unspecified: Secondary | ICD-10-CM

## 2018-11-10 LAB — CBC WITH DIFFERENTIAL/PLATELET
Abs Immature Granulocytes: 0.01 10*3/uL (ref 0.00–0.07)
Basophils Absolute: 0 10*3/uL (ref 0.0–0.1)
Basophils Relative: 0 %
Eosinophils Absolute: 0.1 10*3/uL (ref 0.0–0.5)
Eosinophils Relative: 2 %
HCT: 42.9 % (ref 39.0–52.0)
Hemoglobin: 13.9 g/dL (ref 13.0–17.0)
Immature Granulocytes: 0 %
Lymphocytes Relative: 17 %
Lymphs Abs: 1.3 10*3/uL (ref 0.7–4.0)
MCH: 29.6 pg (ref 26.0–34.0)
MCHC: 32.4 g/dL (ref 30.0–36.0)
MCV: 91.5 fL (ref 80.0–100.0)
Monocytes Absolute: 0.7 10*3/uL (ref 0.1–1.0)
Monocytes Relative: 9 %
Neutro Abs: 5.6 10*3/uL (ref 1.7–7.7)
Neutrophils Relative %: 72 %
Platelets: 203 10*3/uL (ref 150–400)
RBC: 4.69 MIL/uL (ref 4.22–5.81)
RDW: 12.8 % (ref 11.5–15.5)
WBC: 7.7 10*3/uL (ref 4.0–10.5)
nRBC: 0 % (ref 0.0–0.2)

## 2018-11-10 LAB — BASIC METABOLIC PANEL
Anion gap: 10 (ref 5–15)
BUN: 17 mg/dL (ref 6–20)
CO2: 23 mmol/L (ref 22–32)
Calcium: 8.9 mg/dL (ref 8.9–10.3)
Chloride: 106 mmol/L (ref 98–111)
Creatinine, Ser: 0.8 mg/dL (ref 0.61–1.24)
GFR calc Af Amer: >60 mL/min (ref >60)
GFR calc non Af Amer: >60 mL/min (ref >60)
Glucose, Bld: 129 mg/dL — ABNORMAL HIGH (ref 70–99)
Potassium: 3.9 mmol/L (ref 3.5–5.1)
Sodium: 139 mmol/L (ref 135–145)

## 2018-11-10 LAB — SARS CORONAVIRUS 2 BY RT PCR (HOSPITAL ORDER, PERFORMED IN ~~LOC~~ HOSPITAL LAB): SARS Coronavirus 2: NEGATIVE

## 2018-11-10 MED ORDER — VANCOMYCIN HCL IN DEXTROSE 1-5 GM/200ML-% IV SOLN
1.00 | INTRAVENOUS | Status: DC
Start: 2018-11-10 — End: 2018-11-10

## 2018-11-10 MED ORDER — SODIUM CHLORIDE 0.9 % IV SOLN
INTRAVENOUS | Status: DC | PRN
  Administered 2018-11-10: 05:00:00 via INTRAVENOUS

## 2018-11-10 MED ORDER — ACETAMINOPHEN 500 MG PO TABS
1000.00 | ORAL_TABLET | ORAL | Status: DC
Start: ? — End: 2018-11-10

## 2018-11-10 MED ORDER — OXYCODONE-ACETAMINOPHEN 5-325 MG PO TABS
1.0000 | ORAL_TABLET | Freq: Once | ORAL | Status: AC
  Administered 2018-11-10: 1 via ORAL
  Filled 2018-11-10: qty 1

## 2018-11-10 MED ORDER — VANCOMYCIN HCL IN DEXTROSE 1-5 GM/200ML-% IV SOLN: 1000.0000 mg | Freq: Once | INTRAVENOUS | Status: DC

## 2018-11-10 MED ORDER — MORPHINE SULFATE (PF) 4 MG/ML IV SOLN
4.0000 mg | Freq: Once | INTRAVENOUS | Status: AC
  Administered 2018-11-10: 4 mg via INTRAVENOUS
  Filled 2018-11-10: qty 1

## 2018-11-10 MED ORDER — GENERIC EXTERNAL MEDICATION
3.00 | Status: DC
Start: 2018-11-10 — End: 2018-11-10

## 2018-11-10 MED ORDER — CEFAZOLIN SODIUM-DEXTROSE 1-4 GM/50ML-% IV SOLN
1.0000 g | Freq: Once | INTRAVENOUS | Status: AC
Start: 2018-11-10 — End: 2018-11-10
  Administered 2018-11-10: 1 g via INTRAVENOUS
  Filled 2018-11-10: qty 50

## 2018-11-10 MED ORDER — OXYCODONE-ACETAMINOPHEN 5-325 MG PO TABS
1.00 | ORAL_TABLET | ORAL | Status: DC
Start: ? — End: 2018-11-10

## 2018-11-10 MED ORDER — HCA TUSSIN 100 MG/5ML PO SYRP
15.00 | ORAL_SOLUTION | ORAL | Status: DC
Start: ? — End: 2018-11-10

## 2018-11-10 MED ORDER — FENTANYL CITRATE (PF) 100 MCG/2ML IJ SOLN
25.0000 ug | Freq: Once | INTRAMUSCULAR | Status: AC
Start: 2018-11-10 — End: 2018-11-10
  Administered 2018-11-10: 25 ug via INTRAVENOUS
  Filled 2018-11-10: qty 2

## 2018-11-10 MED ORDER — ONDANSETRON HCL 4 MG/2ML IJ SOLN
4.00 | INTRAMUSCULAR | Status: DC
Start: 2018-11-10 — End: 2018-11-10

## 2018-11-10 MED ORDER — DOCUSATE SODIUM 100 MG PO CAPS
100.00 | ORAL_CAPSULE | ORAL | Status: DC
Start: ? — End: 2018-11-10

## 2018-11-10 NOTE — ED Notes (Signed)
Contacted Dr. Alain Marion (Hand Surgeon) for consult

## 2018-11-10 NOTE — ED Notes (Signed)
Contacted PAL @ [facility] for consult

## 2018-11-10 NOTE — ED Notes (Signed)
[facility] here for transport.

## 2018-11-10 NOTE — ED Notes (Signed)
Contacted Administrator Olivette) @ [facility] to try to connect with patient's surgeon, Dr. Luana Shu or ortho/hand surgeon on call.

## 2018-11-10 NOTE — ED Triage Notes (Signed)
Pt states he took Ibuprofen 600 mg around MN, 1000 mg of Tylenol ~5-6pm.

## 2018-11-10 NOTE — ED Notes (Signed)
Contacted [facility](Jaime) - trucks are backed up [employer] for transportation

## 2018-11-10 NOTE — ED Provider Notes (Addendum)
[facility] EMERGENCY DEPARTMENT Provider Note   CSN: [id number] Arrival date & time: 11/10/18  0209     History   Chief Complaint Chief Complaint  Patient presents with   Wound Check    HPI Alan Odonnell is a 53 y.o. male.     HPI  This a 53 year old male with a history of hypertension, hyperlipidemia, tobacco use who presents with concern for postoperative pain and infection.  Patient reports that he had trigger release on his right second digit at the [facility] in [place] by Dr. Excell Seltzer on 10/2.  He states he has been doing very well.  He normally keeps a waterproof bandage over the incision site during the day and just a dry bandage at night.  He had not noted any drainage or pain.  He states over the last 2 days he has noted increasing pain specifically at the incision site and extending into his right second digit.  He also has noted that his bandage has had drainage from the incision which is new.  He describes thick bloody and yellow drainage.  Denies overt pus.  He has not had any fevers.  He states he has had increasing significant pain tonight over the last 8 hrs and that is why he came.  He reports that he took Tylenol and Motrin with minimal relief.  Currently he rates his pain at 9 out of 10.  It is worse with extension of the finger.  Past Medical History:  Diagnosis Date   ADD (attention deficit disorder)    DDD (degenerative disc disease), lumbar    Exposure to TB    HTN (hypertension) 04/22/2014    Patient Active Problem List   Diagnosis Date Noted   HTN (hypertension) 04/22/2014   Hyperlipidemia 04/22/2014   Pain in the chest    CAD- LAD disease by CT 03/17/2014   Bronchitis    SOB (shortness of breath)    Chest pain 03/15/2014   Chest pain at rest 03/15/2014   Tobacco abuse 03/15/2014   ADD (attention deficit disorder) 03/15/2014   Degenerative disc disease, lumbar 10/08/2011    Past Surgical History:  Procedure Laterality Date    BACK SURGERY     LEFT HEART CATHETERIZATION WITH CORONARY ANGIOGRAM N/A 03/17/2014   Procedure: LEFT HEART CATHETERIZATION WITH CORONARY ANGIOGRAM;  Surgeon: Iran Ouch, MD;  Location: [facility]ATH [facility];  Service: Cardiovascular;  Laterality: N/A;   SHOULDER SURGERY     TRIGGER FINGER RELEASE          Home Medications    Prior to Admission medications   Medication Sig Start Date End Date Taking? Authorizing Provider  esomeprazole (NEXIUM) 40 MG capsule Take 40 mg by mouth daily at 12 noon.   Yes [provider]  amphetamine-dextroamphetamine (ADDERALL) 20 MG tablet Take 20 mg by mouth 2 (two) times daily.    [provider]  aspirin 81 MG tablet Take 1 tablet (81 mg total) by mouth daily. 03/18/14   Albertine Grates, MD  atorvastatin (LIPITOR) 40 MG tablet Take 1 tablet (40 mg total) by mouth daily at 6 PM. 06/11/14   Lars Masson, MD  carvedilol (COREG) 3.125 MG tablet Take 1 tablet (3.125 mg total) by mouth 2 (two) times daily with a meal. 06/11/14   Lars Masson, MD  fluticasone (FLONASE) 50 MCG/ACT nasal spray Place 1 spray into both nostrils daily. 03/18/14   Albertine Grates, MD  nicotine (NICODERM CQ - DOSED IN MG/24 HOURS) 21  mg/24hr patch Place 1 patch (21 mg total) onto the skin daily. Start on 09/03/14 09/03/14   Lars MassonNelson, Katarina H, MD  nicotine (NICODERM CQ) 14 mg/24hr patch Place 1 patch (14 mg total) onto the skin daily. Start on 09/16/14, once 21 mg patch done 09/03/14   Lars MassonNelson, Katarina H, MD  nicotine (NICODERM CQ) 7 mg/24hr patch Place 1 patch (7 mg total) onto the skin daily. Start on 09/30/14 after 14 mg patch 09/03/14   Lars MassonNelson, Katarina H, MD  pantoprazole (PROTONIX) 40 MG tablet Take 1 tablet (40 mg total) by mouth daily. 06/11/14   Lars MassonNelson, Katarina H, MD    Family History Family History  Problem Relation Age of Onset   Stroke Mother        MULTIPLE   Heart attack Mother    Suicidality Father 7045    Social History Social History   Tobacco Use    Smoking status: Former Smoker    Types: Cigarettes    Quit date: 11/10/2014    Years since quitting: 4.0   Smokeless tobacco: Never Used   Tobacco comment: per pt "light" smoker since age 53, did quit for 5 yrs in between, however has since picked up again  Substance Use Topics   Alcohol use: Yes    Alcohol/week: 0.0 standard drinks    Comment: 1-2 glass of wine per night   Drug use: No     Allergies   Rofecoxib   Review of Systems Review of Systems  Constitutional: Negative for fever.  Respiratory: Negative for shortness of breath.   Cardiovascular: Negative for chest pain.  Musculoskeletal:       Right hand and finger pain  Skin: Positive for wound. Negative for color change.  All other systems reviewed and are negative.    Physical Exam Updated Vital Signs Ht 1.753 m (5\' 9" )    Wt 84.8 kg    BMI 27.62 kg/m   Physical Exam Vitals signs and nursing note reviewed.  Constitutional:      Appearance: He is well-developed. He is not ill-appearing.  HENT:     Head: Normocephalic and atraumatic.     Mouth/Throat:     Mouth: Mucous membranes are moist.  Eyes:     Pupils: Pupils are equal, round, and reactive to light.  Neck:     Musculoskeletal: Neck supple.  Cardiovascular:     Rate and Rhythm: Normal rate and regular rhythm.  Pulmonary:     Effort: Pulmonary effort is normal. No respiratory distress.  Musculoskeletal:     Comments: Focused examination of the right hand with a 1.5 cm incision just proximal to the right second digit, 2 stitches in place, no significant erythema/mild, there is significant tenderness to palpation, swelling and only slight oozing, unable to express any purulence but wound appears wet and with poor healing.  Right second digit is in a slightly flexed position with mild to moderate fusiform swelling noted, no significant erythema, there is pain with extension of the finger, 2+ radial pulse  Skin:    General: Skin is warm and dry.      Findings: No erythema.  Neurological:     Mental Status: He is alert and oriented to person, place, and time.  Psychiatric:        Mood and Affect: Mood normal.          ED Treatments / Results  Labs (all labs ordered are listed, but only abnormal results are displayed) Labs Reviewed  BASIC METABOLIC  PANEL - Abnormal; Notable for the following components:      Result Value   Glucose, Bld 129 (*)    All other components within normal limits  SARS CORONAVIRUS 2 BY RT PCR (HOSPITAL ORDER, PERFORMED IN [facility])  CBC WITH DIFFERENTIAL/PLATELET    EKG None  Radiology Dg Hand Complete Right  Result Date: 11/10/2018 CLINICAL DATA:  53 year old male status post right hand surgery at the [facility] ten days ago. Purulent Op site with swelling and pain. EXAM: RIGHT HAND - COMPLETE 3+ VIEW COMPARISON:  Outside right hand series 12/21/2016. FINDINGS: No soft tissue gas identified. No radiopaque foreign body identified. The discrete soft tissue wound is not evident, but the proximal 4th finger may be most swollen. Joint spaces and alignment are preserved. No osteolysis. No acute osseous abnormality identified. IMPRESSION: No soft tissue gas, retained radiopaque foreign body, or acute osseous abnormality identified. Electronically Signed   By: Odessa Fleming M.D.   On: 11/10/2018 03:01    Procedures Procedures (including critical care time)   Medications Ordered in ED Medications  vancomycin (VANCOCIN) IVPB 1000 mg/200 mL premix (has no administration in time range)  ceFAZolin (ANCEF) IVPB 1 g/50 mL premix (has no administration in time range)  morphine 4 MG/ML injection 4 mg (has no administration in time range)  oxyCODONE-acetaminophen (PERCOCET/ROXICET) 5-325 MG per tablet 1 tablet (1 tablet Oral Given 11/10/18 0240)  fentaNYL (SUBLIMAZE) injection 25 mcg (25 mcg Intravenous Given 11/10/18 0321)     Initial Impression / Assessment and Plan / ED Course  I have reviewed the  triage vital signs and the nursing notes.  Pertinent labs & imaging results that were available during my care of the patient were reviewed by me and considered in my medical decision making (see chart for details).  Clinical Course as of Nov 10 435  Mon Nov 10, 2018  9741 Patient with continued pain.  Only reports transient improvement with Percocet and fentanyl.   [CH]  [phone] Spoke with Dr. Nils Flack, general orthopedics at the [facility].  He is covering for Dr. Excell Seltzer.  States that if the concern is for need for debridement or incision and drainage, patient will need to be admitted and taking care of at a facility other than the [facility] because they do not do urgent or emergent procedures.  He will place a community care consult.   [CH]  0345 Spoke with Dr. Margarita Rana, orthopedist on for hand surgery.  I discussed with him the patient and his recent procedure and my concerns for possible early infection or flexor tenosynovitis.  He is reviewed the chart.  [facility] Dr[facility] Cyril Mourning so he could discuss this with him specifically.   [CH]  0400 Spoke with [facility] administration, Moe.  He confirms that they are unable to perform any urgent or emergent procedures.  He stated that if the patient were admitted to their emergency room, they would need to transfer the patient to an outside facility.  This was relayed to Dr. Eulah Pont.  Given that is the flexor surface of the hand, Dr. Eulah Pont (not a fellowship trained hand surgeon) requests transfer to [facility] to have a fellowship trained hand surgeon evaluate given concerns and possible need for incision, drainage, and washout.  He does recommend giving the patient Unasyn and vancomycin.   [CH]  0425 Spoke to Dr. Margaret Pyle Surgery at [facility].  He has accepted the patient  to be evaluated in the emergency department.   [CH]  5364 Checked on the patient.  He was updated on the plan of care.  He reports to me that he just  expressed a significant amount of purulence from his wound.  I have reevaluated the wound.  Continues to have no significant erythema but he has exquisite tenderness to palpation and I now can express some purulence.  He still has pain with passive extension of the right second digit.  I discussed with him that he will be evaluated by hand surgery in the emergency department.  They may be able to address his issue at the bedside and send him home with antibiotics.  However, he was made n.p.o. and a COVID swab was sent in case he needs further evaluation or deeper debridement.   [CH]    Clinical Course User Index [CH] Durene Dodge, Barbette Hair, MD       Patient presents for postoperative wound evaluation.  He is overall nontoxic-appearing and vital signs are reassuring.  Patient reports exquisite increasing tenderness over the last 6 to 8 hours and swelling and drainage over the last 2 to 3 days.  Denies fevers.  Clinical exam is concerning for possible deep tendon sheath infection given + Kanavel's sign.  He has only mild to no significant erythema to suggest cellulitis.  Initially I was unable to express any drainage from the site; however, on recheck, I was able to express some purulence.  The actual surgical site has 2 retained sutures and is wet and not well healing.  See clinical course above.  Patient is not able to be cared for by his primary surgeon at the [place].  He will be transferred to [facility] for hand surgery evaluation.  He was given vancomycin and Unasyn.  He was made n.p.o. and COVID testing was sent.  Additionally, lab work was obtained.  No significant leukocytosis or metabolic derangements.   Final Clinical Impressions(s) / ED Diagnoses   Final diagnoses:  Flexor tenosynovitis of finger  Wound infection    ED Discharge Orders    None       Stephonie Wilcoxen, Barbette Hair, MD 11/10/18 6803    Merryl Hacker, MD 11/10/18 [phone]

## 2018-11-10 NOTE — ED Triage Notes (Signed)
Pt states he had trigger release on R ring finger on 10/2. Reports drainage and increased pain from site that started 2 days ago. Procedure done at [place] in [place].

## 2018-11-10 NOTE — ED Triage Notes (Signed)
EDP at bedside  

## 2018-11-10 NOTE — Consult Note (Signed)
I discussed this case with EDP. She has also discussed with Surgeon's call coverage who states they are unable to do urgent check or procedure on the patient and recommend transfer to [facility].   I think this plan is appropriate as it is recommended by the patients surgeon's call team. Patient may need extensive volar I&D and this can be handled well by Hand surgeon at [facility] if needed.     Alan Odonnell

## 2018-11-27 ENCOUNTER — Emergency Department (HOSPITAL_BASED_OUTPATIENT_CLINIC_OR_DEPARTMENT_OTHER): Payer: No Typology Code available for payment source

## 2018-11-27 ENCOUNTER — Encounter (HOSPITAL_BASED_OUTPATIENT_CLINIC_OR_DEPARTMENT_OTHER): Payer: Self-pay

## 2018-11-27 ENCOUNTER — Other Ambulatory Visit: Payer: Self-pay

## 2018-11-27 ENCOUNTER — Emergency Department (HOSPITAL_BASED_OUTPATIENT_CLINIC_OR_DEPARTMENT_OTHER)
Admission: EM | Admit: 2018-11-27 | Discharge: 2018-11-27 | Disposition: A | Discharge status: Home / Self Care | Payer: No Typology Code available for payment source | Attending: Emergency Medicine | Admitting: Emergency Medicine

## 2018-11-27 DIAGNOSIS — E785 Hyperlipidemia, unspecified: Secondary | ICD-10-CM | POA: Diagnosis not present

## 2018-11-27 DIAGNOSIS — Z87891 Personal history of nicotine dependence: Secondary | ICD-10-CM | POA: Diagnosis not present

## 2018-11-27 DIAGNOSIS — J111 Influenza due to unidentified influenza virus with other respiratory manifestations: Secondary | ICD-10-CM | POA: Insufficient documentation

## 2018-11-27 DIAGNOSIS — Z20828 Contact with and (suspected) exposure to other viral communicable diseases: Secondary | ICD-10-CM | POA: Insufficient documentation

## 2018-11-27 DIAGNOSIS — I10 Essential (primary) hypertension: Secondary | ICD-10-CM | POA: Insufficient documentation

## 2018-11-27 DIAGNOSIS — Z79899 Other long term (current) drug therapy: Secondary | ICD-10-CM | POA: Insufficient documentation

## 2018-11-27 DIAGNOSIS — Z7982 Long term (current) use of aspirin: Secondary | ICD-10-CM | POA: Insufficient documentation

## 2018-11-27 DIAGNOSIS — Z888 Allergy status to other drugs, medicaments and biological substances status: Secondary | ICD-10-CM | POA: Diagnosis not present

## 2018-11-27 DIAGNOSIS — R509 Fever, unspecified: Secondary | ICD-10-CM | POA: Diagnosis present

## 2018-11-27 LAB — CBC WITH DIFFERENTIAL/PLATELET
Abs Immature Granulocytes: 0.02 10*3/uL (ref 0.00–0.07)
Basophils Absolute: 0 10*3/uL (ref 0.0–0.1)
Basophils Relative: 0 %
Eosinophils Absolute: 0.2 10*3/uL (ref 0.0–0.5)
Eosinophils Relative: 10 %
HCT: 40.6 % (ref 39.0–52.0)
Hemoglobin: 13.4 g/dL (ref 13.0–17.0)
Immature Granulocytes: 1 %
Lymphocytes Relative: 21 %
Lymphs Abs: 0.4 10*3/uL — ABNORMAL LOW (ref 0.7–4.0)
MCH: 29.3 pg (ref 26.0–34.0)
MCHC: 33 g/dL (ref 30.0–36.0)
MCV: 88.8 fL (ref 80.0–100.0)
Monocytes Absolute: 0.3 10*3/uL (ref 0.1–1.0)
Monocytes Relative: 15 %
Neutro Abs: 0.9 10*3/uL — ABNORMAL LOW (ref 1.7–7.7)
Neutrophils Relative %: 53 %
Platelets: 175 10*3/uL (ref 150–400)
RBC: 4.57 MIL/uL (ref 4.22–5.81)
RDW: 12.7 % (ref 11.5–15.5)
Smear Review: NORMAL
WBC: 1.7 10*3/uL — ABNORMAL LOW (ref 4.0–10.5)
nRBC: 0 % (ref 0.0–0.2)

## 2018-11-27 LAB — BASIC METABOLIC PANEL
Anion gap: 7 (ref 5–15)
BUN: 14 mg/dL (ref 6–20)
CO2: 21 mmol/L — ABNORMAL LOW (ref 22–32)
Calcium: 8.8 mg/dL — ABNORMAL LOW (ref 8.9–10.3)
Chloride: 104 mmol/L (ref 98–111)
Creatinine, Ser: 0.8 mg/dL (ref 0.61–1.24)
GFR calc Af Amer: >60 mL/min (ref >60)
GFR calc non Af Amer: >60 mL/min (ref >60)
Glucose, Bld: 128 mg/dL — ABNORMAL HIGH (ref 70–99)
Potassium: 3.8 mmol/L (ref 3.5–5.1)
Sodium: 132 mmol/L — ABNORMAL LOW (ref 135–145)

## 2018-11-27 LAB — URINALYSIS, ROUTINE W REFLEX MICROSCOPIC
Bilirubin Urine: NEGATIVE
Glucose, UA: NEGATIVE mg/dL
Hgb urine dipstick: NEGATIVE
Ketones, ur: NEGATIVE mg/dL
Leukocytes,Ua: NEGATIVE
Nitrite: NEGATIVE
Protein, ur: NEGATIVE mg/dL
Specific Gravity, Urine: 1.025 (ref 1.005–1.030)
pH: 6 (ref 5.0–8.0)

## 2018-11-27 MED ORDER — AMOXICILLIN 500 MG PO CAPS: 500.0000 mg | ORAL_CAPSULE | Freq: Three times a day (TID) | ORAL | 0 refills | Status: AC

## 2018-11-27 MED ORDER — SODIUM CHLORIDE 0.9 % IV BOLUS
1000.0000 mL | Freq: Once | INTRAVENOUS | Status: AC
  Administered 2018-11-27: 11:00:00 1000 mL via INTRAVENOUS

## 2018-11-27 NOTE — Discharge Instructions (Signed)
Follow-up with primary care doctor, hand doctor.  Return to the emergency department if symptoms worsen.  Self isolate at home until you hear back from your coronavirus test.  Take antibiotic as prescribed.

## 2018-11-27 NOTE — ED Provider Notes (Signed)
[facility] EMERGENCY DEPARTMENT Provider Note   CSN: [id number] Arrival date & time: 11/27/18  1914     History   Chief Complaint Chief Complaint  Patient presents with   Fever    HPI Alan Odonnell is a 53 y.o. male.     The history is provided by the patient.  Fever Max temp prior to arrival:  10 Temp source:  Oral Onset quality:  Gradual Duration:  4 days Timing:  Intermittent Progression:  Waxing and waning Chronicity:  New Relieved by:  Acetaminophen Worsened by:  Nothing Associated symptoms: headaches and myalgias   Associated symptoms: no chest pain, no chills, no cough, no dysuria, no ear pain, no rash, no sore throat and no vomiting   Risk factors: recent sickness (currently on bactrim for right hand infection )     Past Medical History:  Diagnosis Date   ADD (attention deficit disorder)    DDD (degenerative disc disease), lumbar    Exposure to TB    HTN (hypertension) 04/22/2014    Patient Active Problem List   Diagnosis Date Noted   HTN (hypertension) 04/22/2014   Hyperlipidemia 04/22/2014   Pain in the chest    CAD- LAD disease by CT 03/17/2014   Bronchitis    SOB (shortness of breath)    Chest pain 03/15/2014   Chest pain at rest 03/15/2014   Tobacco abuse 03/15/2014   ADD (attention deficit disorder) 03/15/2014   Degenerative disc disease, lumbar 10/08/2011    Past Surgical History:  Procedure Laterality Date   BACK SURGERY     LEFT HEART CATHETERIZATION WITH CORONARY ANGIOGRAM N/A 03/17/2014   Procedure: LEFT HEART CATHETERIZATION WITH CORONARY ANGIOGRAM;  Surgeon: Iran Ouch, MD;  Location: [facility] CATH LAB;  Service: Cardiovascular;  Laterality: N/A;   SHOULDER SURGERY     TRIGGER FINGER RELEASE          Home Medications    Prior to Admission medications   Medication Sig Start Date End Date Taking? Authorizing Provider  amoxicillin (AMOXIL) 500 MG capsule Take 1 capsule (500 mg total) by mouth 3  (three) times daily for 10 days. 11/27/18 12/07/18  Levaughn Puccinelli, DO  amphetamine-dextroamphetamine (ADDERALL) 20 MG tablet Take 20 mg by mouth 2 (two) times daily.    [provider]  aspirin 81 MG tablet Take 1 tablet (81 mg total) by mouth daily. 03/18/14   Albertine Grates, MD  atorvastatin (LIPITOR) 40 MG tablet Take 1 tablet (40 mg total) by mouth daily at 6 PM. 06/11/14   Lars Masson, MD  carvedilol (COREG) 3.125 MG tablet Take 1 tablet (3.125 mg total) by mouth 2 (two) times daily with a meal. 06/11/14   Lars Masson, MD  esomeprazole (NEXIUM) 40 MG capsule Take 40 mg by mouth daily at 12 noon.    [provider]  fluticasone (FLONASE) 50 MCG/ACT nasal spray Place 1 spray into both nostrils daily. 03/18/14   Albertine Grates, MD  nicotine (NICODERM CQ - DOSED IN MG/24 HOURS) 21 mg/24hr patch Place 1 patch (21 mg total) onto the skin daily. Start on 09/03/14 09/03/14   Lars Masson, MD  nicotine (NICODERM CQ) 14 mg/24hr patch Place 1 patch (14 mg total) onto the skin daily. Start on 09/16/14, once 21 mg patch done 09/03/14   Lars Masson, MD  nicotine (NICODERM CQ) 7 mg/24hr patch Place 1 patch (7 mg total) onto the skin daily. Start on 09/30/14 after 14 mg patch 09/03/14  Lars Masson, MD  pantoprazole (PROTONIX) 40 MG tablet Take 1 tablet (40 mg total) by mouth daily. 06/11/14   Lars Masson, MD    Family History Family History  Problem Relation Age of Onset   Stroke Mother        MULTIPLE   Heart attack Mother    Suicidality Father 30    Social History Social History   Tobacco Use   Smoking status: Former Smoker    Types: Cigarettes    Quit date: 11/10/2014    Years since quitting: 4.0   Smokeless tobacco: Never Used   Tobacco comment: per pt "light" smoker since age 20, did quit for 5 yrs in between, however has since picked up again  Substance Use Topics   Alcohol use: Yes    Alcohol/week: 0.0 standard drinks    Comment: 1-2 glass of wine  per night   Drug use: No     Allergies   Rofecoxib   Review of Systems Review of Systems  Constitutional: Positive for fever. Negative for chills.  HENT: Negative for ear pain and sore throat.   Eyes: Negative for pain and visual disturbance.  Respiratory: Negative for cough and shortness of breath.   Cardiovascular: Negative for chest pain and palpitations.  Gastrointestinal: Negative for abdominal pain and vomiting.  Genitourinary: Negative for dysuria and hematuria.  Musculoskeletal: Positive for myalgias. Negative for arthralgias and back pain.  Skin: Negative for color change and rash.  Neurological: Positive for weakness (generalized run down feeeling ) and headaches. Negative for seizures and syncope.  All other systems reviewed and are negative.    Physical Exam Updated Vital Signs  ED Triage Vitals  Enc Vitals Group     BP 11/27/18 0944 120/85     Pulse Rate 11/27/18 0944 89     Resp 11/27/18 0944 12     Temp 11/27/18 0944 98.5 F (36.9 C)     Temp Source 11/27/18 0944 Oral     SpO2 11/27/18 0944 99 %     Weight 11/27/18 0945 187 lb (84.8 kg)     Height 11/27/18 0945  (1.753 m)     Head Circumference --      Peak Flow --      Pain Score 11/27/18 0945 6     Pain Loc --      Pain Edu? --      Excl. in GC? --     Physical Exam Vitals signs and nursing note reviewed.  Constitutional:      General: He is not in acute distress.    Appearance: He is well-developed. He is not ill-appearing.  HENT:     Head: Normocephalic and atraumatic.     Nose: Nose normal.     Mouth/Throat:     Mouth: Mucous membranes are moist.  Eyes:     Extraocular Movements: Extraocular movements intact.     Conjunctiva/sclera: Conjunctivae normal.     Pupils: Pupils are equal, round, and reactive to light.  Neck:     Musculoskeletal: Normal range of motion and neck supple. No neck rigidity or muscular tenderness.  Cardiovascular:     Rate and Rhythm: Normal rate and  regular rhythm.     Pulses: Normal pulses.     Heart sounds: Normal heart sounds. No murmur.  Pulmonary:     Effort: Pulmonary effort is normal. No respiratory distress.     Breath sounds: Normal breath sounds.  Abdominal:  Palpations: Abdomen is soft.     Tenderness: There is no abdominal tenderness.  Musculoskeletal: Normal range of motion.        General: No swelling or tenderness.  Skin:    General: Skin is warm and dry.     Comments: Right hand overall without any signs of infection, packing in place  Neurological:     General: No focal deficit present.     Mental Status: He is alert.  Psychiatric:        Mood and Affect: Mood normal.      ED Treatments / Results  Labs (all labs ordered are listed, but only abnormal results are displayed) Labs Reviewed  CBC WITH DIFFERENTIAL/PLATELET - Abnormal; Notable for the following components:      Result Value   WBC 1.7 (*)    Neutro Abs 0.9 (*)    Lymphs Abs 0.4 (*)    All other components within normal limits  BASIC METABOLIC PANEL - Abnormal; Notable for the following components:   Sodium 132 (*)    CO2 21 (*)    Glucose, Bld 128 (*)    Calcium 8.8 (*)    All other components within normal limits  CULTURE, BLOOD (ROUTINE X 2)  CULTURE, BLOOD (ROUTINE X 2)  NOVEL CORONAVIRUS, NAA (HOSP ORDER, SEND-OUT TO REF LAB; TAT 18-24 HRS)  URINALYSIS, ROUTINE W REFLEX MICROSCOPIC  PATHOLOGIST SMEAR REVIEW    EKG None  Radiology Dg Chest Portable 1 View  Result Date: 11/27/2018 CLINICAL DATA:  Fever. EXAM: PORTABLE CHEST 1 VIEW COMPARISON:  03/15/2014. FINDINGS: Mediastinum and hilar structures normal. Lungs are clear. No pleural effusion or pneumothorax. Heart size normal. No acute bony abnormality. IMPRESSION: No acute cardiopulmonary disease. Electronically Signed   By: Marcello Moores  Register   On: 11/27/2018 10:14    Procedures Procedures (including critical care time)  Medications Ordered in ED Medications  sodium  chloride 0.9 % bolus 1,000 mL (1,000 mLs Intravenous New Bag/Given 11/27/18 1033)     Initial Impression / Assessment and Plan / ED Course  I have reviewed the triage vital signs and the nursing notes.  Pertinent labs & imaging results that were available during my care of the patient were reviewed by me and considered in my medical decision making (see chart for details).     Alan Odonnell is a 53 year old male with history of hypertension, high cholesterol, shortness of breath he presents to the ED with fever, generalized weakness.  Patient with recent I&D of right hand wound.  Overall right hand appears well.  No signs of ongoing infection in the hand.  Patient was told that he was supposed to start amoxicillin but has not gotten the prescription yet.  Wound cultures were sensitive to amoxicillin.  He is currently on Bactrim.  Denies any respiratory symptoms.  He is concerned for coronavirus.  Does not have any abdominal pain.  Vital signs are normal.  No fever.  Low concern for sepsis.  Patient has a white count of 1.7.  Was 2.3 several days ago.  Otherwise lab work is unremarkable.  No significant anemia, electrolyte abnormality, kidney injury.  Urinalysis showed no signs of infection.  Chest x-ray showed no signs of infection.  Overall he appears well.  Will reswab for coronavirus.  Recommend self-isolation at home.  Blood cultures were collected in case an occult bacteremia given his recent hand infection although vital signs are reassuring.  No signs of active infection of the right hand.  Understands return  precautions and discharged in the ED in good condition.  This chart was dictated using voice recognition software.  Despite best efforts to proofread,  errors can occur which can change the documentation meaning.  Alan Odonnell was evaluated in Emergency Department on 11/27/2018 for the symptoms described in the history of present illness. He was evaluated in the context of the global  COVID-19 pandemic, which necessitated consideration that the patient might be at risk for infection with the SARS-CoV-2 virus that causes COVID-19. Institutional protocols and algorithms that pertain to the evaluation of patients at risk for COVID-19 are in a state of rapid change based on information released by regulatory bodies including the CDC and federal and state organizations. These policies and algorithms were followed during the patient's care in the ED.   Final Clinical Impressions(s) / ED Diagnoses   Final diagnoses:  Influenza-like illness    ED Discharge Orders         Ordered    amoxicillin (AMOXIL) 500 MG capsule  3 times daily     11/27/18 1133           Allysen Lazo, DO 11/27/18 1135

## 2018-11-27 NOTE — ED Notes (Signed)
ED Provider at bedside. 

## 2018-11-27 NOTE — ED Notes (Signed)
XR at bedside

## 2018-11-27 NOTE — ED Triage Notes (Signed)
Pt reports that he has been in bed for 2 days with exhaustion, headache and fevers, last night fever of 101 - treated with tylenol. 98.5 in triage. Pt has wound to right hand that he had cleaned and cultured at [facility] on [address]. Pt was called last night and told his cultures were positive, pt advised to come to ED. Denies any respiratory symptoms.

## 2018-11-28 LAB — PATHOLOGIST SMEAR REVIEW

## 2018-11-29 LAB — NOVEL CORONAVIRUS, NAA (HOSP ORDER, SEND-OUT TO REF LAB; TAT 18-24 HRS): SARS-CoV-2, NAA: NOT DETECTED

## 2018-12-02 LAB — CULTURE, BLOOD (ROUTINE X 2)
Culture: NO GROWTH
Culture: NO GROWTH
Special Requests: ADEQUATE
